# Patient Record
Sex: Male | Born: 1965
Health system: Southern US, Community
[De-identification: ages and names within clinical notes are randomized; demographics above are authoritative.]

## PROBLEM LIST (undated history)

## (undated) DIAGNOSIS — R221 Localized swelling, mass and lump, neck: Secondary | ICD-10-CM

## (undated) DIAGNOSIS — R7303 Prediabetes: Secondary | ICD-10-CM

## (undated) DIAGNOSIS — E785 Hyperlipidemia, unspecified: Secondary | ICD-10-CM

## (undated) DIAGNOSIS — B019 Varicella without complication: Secondary | ICD-10-CM

## (undated) DIAGNOSIS — K311 Adult hypertrophic pyloric stenosis: Secondary | ICD-10-CM

## (undated) DIAGNOSIS — K219 Gastro-esophageal reflux disease without esophagitis: Secondary | ICD-10-CM

## (undated) HISTORY — DX: Adult hypertrophic pyloric stenosis: K31.1

## (undated) HISTORY — DX: Prediabetes: R73.03

## (undated) HISTORY — DX: Localized swelling, mass and lump, neck: R22.1

## (undated) HISTORY — DX: Varicella without complication: B01.9

## (undated) HISTORY — DX: Gastro-esophageal reflux disease without esophagitis: K21.9

## (undated) HISTORY — PX: VASECTOMY: SHX75

## (undated) HISTORY — PX: NASAL SEPTUM SURGERY: SHX37

## (undated) HISTORY — DX: Hyperlipidemia, unspecified: E78.5

---

## 2005-06-21 ENCOUNTER — Emergency Department (HOSPITAL_COMMUNITY): Admission: EM | Admit: 2005-06-21 | Discharge: 2005-06-21 | Payer: Self-pay | Admitting: Emergency Medicine

## 2005-07-09 ENCOUNTER — Emergency Department (HOSPITAL_COMMUNITY): Admission: EM | Admit: 2005-07-09 | Discharge: 2005-07-09 | Payer: Self-pay | Admitting: Family Medicine

## 2011-01-05 ENCOUNTER — Encounter (HOSPITAL_BASED_OUTPATIENT_CLINIC_OR_DEPARTMENT_OTHER)
Admission: RE | Admit: 2011-01-05 | Discharge: 2011-01-05 | Disposition: A | Discharge status: Home / Self Care | Payer: BC Managed Care – PPO | Source: Ambulatory Visit | Attending: Plastic Surgery | Admitting: Plastic Surgery

## 2011-01-07 ENCOUNTER — Ambulatory Visit (HOSPITAL_BASED_OUTPATIENT_CLINIC_OR_DEPARTMENT_OTHER)
Admission: RE | Admit: 2011-01-07 | Discharge: 2011-01-07 | Disposition: A | Discharge status: Home / Self Care | Payer: BC Managed Care – PPO | Source: Ambulatory Visit | Attending: Plastic Surgery | Admitting: Plastic Surgery

## 2011-01-07 ENCOUNTER — Other Ambulatory Visit: Payer: Self-pay | Admitting: Plastic Surgery

## 2011-01-07 DIAGNOSIS — J342 Deviated nasal septum: Secondary | ICD-10-CM | POA: Insufficient documentation

## 2011-01-07 DIAGNOSIS — Z01812 Encounter for preprocedural laboratory examination: Secondary | ICD-10-CM | POA: Insufficient documentation

## 2011-01-07 DIAGNOSIS — Z01810 Encounter for preprocedural cardiovascular examination: Secondary | ICD-10-CM | POA: Insufficient documentation

## 2011-01-07 DIAGNOSIS — L723 Sebaceous cyst: Secondary | ICD-10-CM | POA: Insufficient documentation

## 2011-01-13 NOTE — Op Note (Signed)
  NAMEBRYSUN, Nicholas Arnold               ACCOUNT NO.:  [id number]  MEDICAL RECORD NO.:  [id number]  LOCATION:                                 FACILITY:  PHYSICIAN:  Wayland Denis, DO      DATE OF BIRTH:  [birth year]  DATE OF PROCEDURE:  01/07/2011 DATE OF DISCHARGE:                              OPERATIVE REPORT   PREOPERATIVE DIAGNOSIS:  Right neck cyst and deviated nasal septum.  POSTOPERATIVE DIAGNOSIS:  Right neck cyst and deviated nasal septum.  PROCEDURE:  Excision of neck cyst 1.5cm and repair of nasal septum and right inferior partial turbinectomy.  ATTENDING:  Wayland Denis, DO  ASSISTANT:  None.  ANESTHESIA:  General.  INDICATIONS FOR PROCEDURE:  [name] is a 45 year old male who has had a growing lesion on the right neck, specific in nature, who also had some trouble issues with breathing due to the septal deviation.  The decision was made to bring him to the OR for repair of the septal deviation and excision of the neck.  DESCRIPTION OF PROCEDURE:  The patient was taken to the operating room, placed in the operating room table in supine position.  General anesthesia was administered.  A time-out was called.  All information was confirmed to be correct.  He was prepped and draped in the usual sterile fashion.  A 1% lidocaine with epinephrine was injected and the neck around the cyst.  A #15-blade was used to make a linear incision over the lesion.  This was done after waiting several minutes for the epinephrine to take effect.  Small scissor was used to dissect around the cyst wall. The mental nerve was seen and left intact.  Hemostasis was achieved with electrocautery. The specimen was sent to pathology.  The area was irrigated with normal saline.  The skin was reapproximated with 5-0 Monocryl and Dermabond was applied, and dry dressings.  A gauze was applied.  Attention was turned to the nose, Afrin pledgets which were placed at the  start of the case were  removed.  1% lidocaine with epinephrine was injected. After waiting several minutes for the epinephrine to take effect a #15 blade was used to make an incision in the right inferior turbinate. The needle tip bovie was used to shrink the left inferior turbinate. A partial turbinectomy was performed with the bone graspers.  The septum was realigned and bilateral nasal splints were place and sutured with a 3-0 ethabond to keep them in place.  Antibiotic ointment was placed on the  nasal splints.  The patient tolerated the procedure well and there were no complications.  He was awoken and taken to recovery room in stable condition.     Wayland Denis, DO     CS/MEDQ  D:  01/07/2011  T:  01/07/2011  Job:  [id number]  Electronically Signed by Wayland Denis  on 01/13/2011 08:39:07 AM

## 2011-03-02 DIAGNOSIS — J3489 Other specified disorders of nose and nasal sinuses: Secondary | ICD-10-CM | POA: Insufficient documentation

## 2015-03-12 ENCOUNTER — Encounter: Payer: Self-pay | Admitting: Primary Care

## 2015-03-12 ENCOUNTER — Encounter (INDEPENDENT_AMBULATORY_CARE_PROVIDER_SITE_OTHER): Payer: Self-pay

## 2015-03-12 ENCOUNTER — Ambulatory Visit (INDEPENDENT_AMBULATORY_CARE_PROVIDER_SITE_OTHER): Payer: BLUE CROSS/BLUE SHIELD | Admitting: Primary Care

## 2015-03-12 VITALS — BP 126/90 | HR 81 | Temp 98.0°F | Ht 67.5 in | Wt 188.0 lb

## 2015-03-12 DIAGNOSIS — Z0000 Encounter for general adult medical examination without abnormal findings: Secondary | ICD-10-CM | POA: Insufficient documentation

## 2015-03-12 DIAGNOSIS — Z800 Family history of malignant neoplasm of digestive organs: Secondary | ICD-10-CM

## 2015-03-12 DIAGNOSIS — R030 Elevated blood-pressure reading, without diagnosis of hypertension: Secondary | ICD-10-CM | POA: Insufficient documentation

## 2015-03-12 DIAGNOSIS — Z23 Encounter for immunization: Secondary | ICD-10-CM

## 2015-03-12 LAB — COMPREHENSIVE METABOLIC PANEL
ALT: 36 U/L (ref 0–53)
AST: 26 U/L (ref 0–37)
Albumin: 4.5 g/dL (ref 3.5–5.2)
Alkaline Phosphatase: 43 U/L (ref 39–117)
BUN: 12 mg/dL (ref 6–23)
CHLORIDE: 103 meq/L (ref 96–112)
CO2: 28 meq/L (ref 19–32)
Calcium: 9.8 mg/dL (ref 8.4–10.5)
Creatinine, Ser: 0.98 mg/dL (ref 0.40–1.50)
GFR: 87.11 mL/min (ref >60.00)
Glucose, Bld: 109 mg/dL — ABNORMAL HIGH (ref 70–99)
POTASSIUM: 4 meq/L (ref 3.5–5.1)
SODIUM: 140 meq/L (ref 135–145)
Total Bilirubin: 0.7 mg/dL (ref 0.2–1.2)
Total Protein: 7 g/dL (ref 6.0–8.3)

## 2015-03-12 LAB — CBC
HCT: 45.9 % (ref 39.0–52.0)
Hemoglobin: 15.4 g/dL (ref 13.0–17.0)
MCHC: 33.7 g/dL (ref 30.0–36.0)
MCV: 86.8 fl (ref 78.0–100.0)
Platelets: 286 10*3/uL (ref 150.0–400.0)
RBC: 5.28 Mil/uL (ref 4.22–5.81)
RDW: 13 % (ref 11.5–15.5)
WBC: 6.1 10*3/uL (ref 4.0–10.5)

## 2015-03-12 LAB — LIPID PANEL
CHOLESTEROL: 272 mg/dL — AB (ref 0–200)
HDL: 52.4 mg/dL (ref >39.00)
LDL CALC: 184 mg/dL — AB (ref 0–99)
NonHDL: 219.94
TRIGLYCERIDES: 180 mg/dL — AB (ref 0.0–149.0)
Total CHOL/HDL Ratio: 5
VLDL: 36 mg/dL (ref 0.0–40.0)

## 2015-03-12 LAB — TSH: TSH: 1.73 u[IU]/mL (ref 0.35–4.50)

## 2015-03-12 LAB — PSA: PSA: 0.49 ng/mL (ref 0.10–4.00)

## 2015-03-12 LAB — HEMOGLOBIN A1C: Hgb A1c MFr Bld: 5.6 % (ref 4.6–6.5)

## 2015-03-12 LAB — VITAMIN D 25 HYDROXY (VIT D DEFICIENCY, FRACTURES): VITD: 41.55 ng/mL (ref 30.00–100.00)

## 2015-03-12 NOTE — Addendum Note (Signed)
Addended by: Jacqualin Combes on: 03/12/2015 03:30 PM   Modules accepted: Orders

## 2015-03-12 NOTE — Patient Instructions (Addendum)
Complete lab work prior to leaving today. I will notify you of your results.  Work to decrease fast food, pop tarts, and consumption of beer.   Start exercising. You should be getting 1 hour of moderate intensity exercise 5 days weekly.  Check your blood pressure daily, around the same time of day, for the next 2 weeks.   Ensure that you have rested for 30 minutes prior to checking your blood pressure. Record your readings and send them to me on My Chart.  You will be contacted regarding your referral to GI for your colonoscopy.  Please let us know if you have not heard back within one week.   It was a pleasure to meet you today! Please don't hesitate to call me with any questions. Welcome to [facility]!

## 2015-03-12 NOTE — Assessment & Plan Note (Signed)
Elevated today in clinic at 126/90, 130/90 upon recheck, has no prior history of elevated readings.  BP checked this morning at Dentist's office and was 120/80. He is to record readings for 2 weeks and send them to me on My Chart.

## 2015-03-12 NOTE — Progress Notes (Signed)
Pre visit review using our clinic review tool, if applicable. No additional management support is needed unless otherwise documented below in the visit note. 

## 2015-03-12 NOTE — Progress Notes (Signed)
Subjective:    Patient ID: Nicholas Arnold, male    DOB: 09-Mar-1966, 49 y.o.   MRN: [id number]  HPI  Nicholas Arnold is a 49 year old male who presents today to establish care and discuss the problems mentioned below. He's not visited a PCP in 10+ years and his last physical was as a child. He also presents today for complete physical.  1) Elevated Blood Pressure Reading: Elevated in clinic today at 126/90, 130/90 with recheck. He has his blood pressure measured this morning at the dentist's office which was 120/80. He admits to being nervous today in the office. He has a co-worker with a BP cuff and will start checking daily for the next 2 weeks.   Immunizations: -Tetanus: Completed over 10 years ago.  -Influenza: He will be getting at work this season.   Diet: Endorses a fair diet. Breakfast: Skips, sometimes oatmeal, pop tarts, grapefruit Lunch: Left overs from dinner, some fast food. Dinner: [place], fish, veggies, potatoes, tacos, chili, grilled venison, chicken. Limits red meat. Occasionally eats out. Snacks: Candy, chips Desserts: Occasionally, ice cream mostly. Beverages: Coffee (3 cups), water, beer ( about 18-24) Exercise: He leads an active lifestyle, but does not exercise daily. Eye exam: Not completed in several years. Dental exam: Completes semi-annually Colonoscopy: Family history of colon cancer (mother's brother) passed away at age 67. He's never had a colonoscopy in the past.    Review of Systems  Constitutional: Negative for unexpected weight change.  HENT: Negative for rhinorrhea.   Respiratory: Negative for cough and shortness of breath.   Cardiovascular: Negative for chest pain.  Gastrointestinal: Negative for diarrhea, constipation and blood in stool.  Genitourinary: Negative for difficulty urinating.  Musculoskeletal: Negative for myalgias and arthralgias.  Skin: Negative for rash.  Allergic/Immunologic: Negative for environmental allergies.  Neurological:  Negative for dizziness, numbness and headaches.  Psychiatric/Behavioral:       Denies concerns for anxiety or depression.        Past Medical History  Diagnosis Date  . GERD (gastroesophageal reflux disease)   . Chickenpox   . Pyloric stenosis   . Neck mass     removed in 2012, benign    Social History   Social History  . Marital Status: Married    Spouse Name: N/A  . Number of Children: N/A  . Years of Education: N/A   Occupational History  . Not on file.   Social History Main Topics  . Smoking status: Never Smoker   . Smokeless tobacco: Not on file  . Alcohol Use: 0.0 oz/week    0 Standard drinks or equivalent per week     Comment: social  . Drug Use: Not on file  . Sexual Activity: Not on file   Other Topics Concern  . Not on file   Social History Narrative   Married.   No children.   Works as a Therapist, nutritional.   Enjoys riding his motorcycle, spending time with pets and family, car maintenance, playing video games.    Past Surgical History  Procedure Laterality Date  . Vasectomy    . Nasal septum surgery      Family History  Problem Relation Age of Onset  . Hyperlipidemia Mother   . Hypertension Mother   . Arthritis Father   . Hyperlipidemia Father   . Hypertension Father   . Colon cancer Maternal Uncle   . Arthritis Maternal Grandmother   . Hyperlipidemia Maternal Grandmother   . Heart disease Maternal  Grandmother   . Hypertension Maternal Grandmother   . Stroke Maternal Grandfather   . Hypertension Maternal Grandfather   . Alcohol abuse Paternal Grandfather     No Known Allergies  No current outpatient prescriptions on file prior to visit.   No current facility-administered medications on file prior to visit.    BP 126/90 mmHg  Pulse 81  Temp(Src) 98 F (36.7 C) (Oral)  Ht 5' 7.5" (1.715 m)  Wt 188 lb (85.276 kg)  BMI 28.99 kg/m2  SpO2 97%    Objective:   Physical Exam  Constitutional: He is oriented to person, place, and  time. He appears well-nourished.  HENT:  Right Ear: Tympanic membrane and ear canal normal.  Left Ear: Tympanic membrane and ear canal normal.  Nose: Nose normal.  Mouth/Throat: Oropharynx is clear and moist.  Eyes: Conjunctivae and EOM are normal. Pupils are equal, round, and reactive to light.  Neck: Neck supple. No thyromegaly present.  Cardiovascular: Normal rate and regular rhythm.   Pulmonary/Chest: Effort normal and breath sounds normal.  Abdominal: Soft. Bowel sounds are normal.  Musculoskeletal: Normal range of motion.  Neurological: He is alert and oriented to person, place, and time. He has normal reflexes. No cranial nerve deficit. Coordination normal.  Skin: Skin is warm and dry.  Psychiatric: He has a normal mood and affect.          Assessment & Plan:

## 2015-03-12 NOTE — Assessment & Plan Note (Addendum)
Tetanus due, provided today. He will get influenza vaccination at work soon. Exam unremarkable. Labs pending. Discussed importance of healthy diet and regular exercise. Decrease pop tarts, beer, fast food. Sent for screening colonoscopy as he has a + family history and has not had screening. Follow up in 1 year for repeat physical.

## 2015-03-13 ENCOUNTER — Encounter: Payer: Self-pay | Admitting: *Deleted

## 2015-03-26 ENCOUNTER — Telehealth: Payer: Self-pay | Admitting: Primary Care

## 2015-03-26 NOTE — Telephone Encounter (Signed)
Will you please check on Nicholas Arnold' BP readings? He was supposed to check for the past 2 weeks. Thanks.

## 2015-03-26 NOTE — Telephone Encounter (Signed)
Message left for patient to return my call.  

## 2015-03-27 NOTE — Telephone Encounter (Deleted)
Called and notified patient of Kate's comments. Patient verbalized understanding.  

## 2015-03-28 ENCOUNTER — Telehealth: Payer: Self-pay | Admitting: Primary Care

## 2015-03-28 NOTE — Telephone Encounter (Signed)
Patient called back. His last readings are  123/79 132/91 116/70 133/82 137/85 122/77 121/94 123/89 126/82 114/81

## 2015-03-28 NOTE — Telephone Encounter (Signed)
Patient returned Chan's call. °

## 2015-03-28 NOTE — Telephone Encounter (Signed)
Noted. Please schedule him for a follow up appointment in 3 months for re-evaluation of BP and cholesterol. Thanks.

## 2015-03-28 NOTE — Telephone Encounter (Signed)
Message left for patient to return my call.  

## 2015-03-28 NOTE — Telephone Encounter (Signed)
Called patient and left message for a call back. See previous phone note.

## 2015-03-31 NOTE — Telephone Encounter (Signed)
Message left for patient to return my call. Sending patient a letter requesting patient to schedule appointment.

## 2015-04-03 ENCOUNTER — Encounter: Payer: Self-pay | Admitting: Gastroenterology

## 2015-06-04 ENCOUNTER — Encounter: Payer: Self-pay | Admitting: Gastroenterology

## 2015-06-04 ENCOUNTER — Ambulatory Visit (INDEPENDENT_AMBULATORY_CARE_PROVIDER_SITE_OTHER): Payer: BLUE CROSS/BLUE SHIELD | Admitting: Gastroenterology

## 2015-06-04 VITALS — BP 128/90 | HR 80 | Ht 67.5 in | Wt 184.4 lb

## 2015-06-04 DIAGNOSIS — K625 Hemorrhage of anus and rectum: Secondary | ICD-10-CM | POA: Diagnosis not present

## 2015-06-04 MED ORDER — NA SULFATE-K SULFATE-MG SULF 17.5-3.13-1.6 GM/177ML PO SOLN: 1.0000 | Freq: Once | ORAL | Status: DC

## 2015-06-04 NOTE — Patient Instructions (Signed)
You will be set up for a colonoscopy for minor rectal bleeding. 

## 2015-06-04 NOTE — Progress Notes (Signed)
HPI: This is a    very pleasant 50 year old man    who was referred to me by Pleas Koch, NP  to evaluate  minor rectal bleeding, family history colon cancer .    Chief complaint is minor rectal bleeding, family history of colon cancer  His wife is in healthcare.    He saw PCP recently.  His uncle had colon cancer (diagnosed in late 68s).  A neighbor died of colon cancer.  He has intermittent red rectal bleeding, along with minor anal pains.  Only very randomly.  Not alarming to him at all.  No real changes in his bowels, except dietary.  Overall weight has increased slowly over many years.  Had pyloric stenosis surgery at 72 weeks old.  Review of systems: Pertinent positive and negative review of systems were noted in the above HPI section. Complete review of systems was performed and was otherwise normal.   Past Medical History  Diagnosis Date  . GERD (gastroesophageal reflux disease)   . Chickenpox   . Pyloric stenosis   . Neck mass     removed in 2012, benign  . Hyperlipemia     Past Surgical History  Procedure Laterality Date  . Vasectomy    . Nasal septum surgery      No current outpatient prescriptions on file.   No current facility-administered medications for this visit.    Allergies as of 06/04/2015  . (No Known Allergies)    Family History  Problem Relation Age of Onset  . Hyperlipidemia Mother   . Hypertension Mother   . Arthritis Father   . Hyperlipidemia Father   . Hypertension Father   . Colon cancer Maternal Uncle   . Arthritis Maternal Grandmother   . Hyperlipidemia Maternal Grandmother   . Heart disease Maternal Grandmother   . Hypertension Maternal Grandmother   . Stroke Maternal Grandfather   . Hypertension Maternal Grandfather   . Alcohol abuse Paternal Grandfather     Social History   Social History  . Marital Status: Married    Spouse Name: N/A  . Number of Children: N/A  . Years of Education: N/A   Occupational  History  . Service Manager    Social History Main Topics  . Smoking status: Never Smoker   . Smokeless tobacco: Never Used  . Alcohol Use: 0.0 oz/week    0 Standard drinks or equivalent per week     Comment: social  . Drug Use: No  . Sexual Activity: Not on file   Other Topics Concern  . Not on file   Social History Narrative   Married.   No children.   Works as a Therapist, nutritional.   Enjoys riding his motorcycle, spending time with pets and family, car maintenance, playing video games.     Physical Exam: BP 128/90 mmHg  Pulse 80  Ht 5' 7.5" (1.715 m)  Wt 184 lb 6.4 oz (83.643 kg)  BMI 28.44 kg/m2 Constitutional: generally well-appearing Psychiatric: alert and oriented x3 Eyes: extraocular movements intact Mouth: oral pharynx moist, no lesions Neck: supple no lymphadenopathy Cardiovascular: heart regular rate and rhythm Lungs: clear to auscultation bilaterally Abdomen: soft, nontender, nondistended, no obvious ascites, no peritoneal signs, normal bowel sounds Extremities: no lower extremity edema bilaterally Skin: no lesions on visible extremities Rectal exam deferred for upcoming colonoscopy  Assessment and plan: 50 y.o. male with  minor rectal bleeding, second-degree relative with colon cancer  I explained to him that a single second-degree relative with  colon cancer does not necessarily increase his personal risk for colon cancer. He would still be screened starting at age 37. He has however had some minor rectal bleeding that I think is unlikely neoplastic in origin but I think it is safest to proceed with full colonoscopy for diagnostic purposes in the next few weeks. I see no reason for any further blood tests or imaging studies prior to that.   Owens Loffler, MD [facility] 06/04/2015, 8:40 AM  Cc: Pleas Koch, NP

## 2015-06-13 ENCOUNTER — Encounter: Payer: Self-pay | Admitting: Primary Care

## 2015-06-13 ENCOUNTER — Telehealth: Payer: Self-pay | Admitting: Primary Care

## 2015-06-13 ENCOUNTER — Ambulatory Visit (INDEPENDENT_AMBULATORY_CARE_PROVIDER_SITE_OTHER): Payer: BLUE CROSS/BLUE SHIELD | Admitting: Primary Care

## 2015-06-13 VITALS — BP 122/84 | HR 65 | Temp 97.8°F | Ht 68.0 in | Wt 184.0 lb

## 2015-06-13 DIAGNOSIS — R030 Elevated blood-pressure reading, without diagnosis of hypertension: Secondary | ICD-10-CM | POA: Diagnosis not present

## 2015-06-13 DIAGNOSIS — E785 Hyperlipidemia, unspecified: Secondary | ICD-10-CM

## 2015-06-13 LAB — LIPID PANEL
CHOLESTEROL: 217 mg/dL — AB (ref 0–200)
HDL: 60.8 mg/dL (ref >39.00)
LDL CALC: 139 mg/dL — AB (ref 0–99)
NONHDL: 156.54
Total CHOL/HDL Ratio: 4
Triglycerides: 87 mg/dL (ref 0.0–149.0)
VLDL: 17.4 mg/dL (ref 0.0–40.0)

## 2015-06-13 NOTE — Progress Notes (Signed)
Pre visit review using our clinic review tool, if applicable. No additional management support is needed unless otherwise documented below in the visit note. 

## 2015-06-13 NOTE — Patient Instructions (Addendum)
Complete lab work prior to leaving today. I will notify you of your results once received.   Continue your efforts towards a healthy diet and exercise. Congratulations on your success towards a healthy lifestyle.  Follow up in mid-late October for repeat physical.  It was a pleasure to see you today!

## 2015-06-13 NOTE — Telephone Encounter (Signed)
Pt returned lab result call. Please advise

## 2015-06-13 NOTE — Assessment & Plan Note (Addendum)
Improved since last visit. Significant improvement in diet. Home readings ranging from 115-130's/60's-80's. Stable in office today. Will continue to monitor.

## 2015-06-13 NOTE — Progress Notes (Signed)
Subjective:    Patient ID: Nicholas Arnold, male    DOB: [birth year], 50 y.o.   MRN: [id number]  HPI  [name] is a 50 year old male who presents today for follow up.  1) Elevated Blood Pressure Reading: He was evaluated as a new patient on 03/12/15 and noted to have elevated diastolic readings at 90 in the office. He sent in his home BP readings 2 weeks later which were averaging 120-130's/80-90's. He presents today with daily home readings ranging from 115-130's-60's-80's. Denies chest pain, dizziness, shortness of breath.   2) Hyperlipidemia: TC of 272, Trigs of 180, and LDL of 184 last visit. He was to work on improving his diet and start exercising. Since his last visit in October he's lost 4 pounds. He's cut back on junk food, alcohol, and fast food. He's increased consumption of fruits, nuts, water. He's kept food journals for awareness of his intake. He's drinking about 2 liters of water daily. He is not currently exercising.   Wt Readings from Last 3 Encounters:  06/13/15 184 lb (83.462 kg)  06/04/15 184 lb 6.4 oz (83.643 kg)  03/12/15 188 lb (85.276 kg)     Review of Systems  Respiratory: Negative for shortness of breath.   Cardiovascular: Negative for chest pain.  Neurological: Negative for dizziness and numbness.       Past Medical History  Diagnosis Date  . GERD (gastroesophageal reflux disease)   . Chickenpox   . Pyloric stenosis   . Neck mass     removed in 2012, benign  . Hyperlipemia     Social History   Social History  . Marital Status: Married    Spouse Name: N/A  . Number of Children: N/A  . Years of Education: N/A   Occupational History  . Service Manager    Social History Main Topics  . Smoking status: Never Smoker   . Smokeless tobacco: Never Used  . Alcohol Use: 0.0 oz/week    0 Standard drinks or equivalent per week     Comment: social  . Drug Use: No  . Sexual Activity: Not on file   Other Topics Concern  . Not on file   Social  History Narrative   Married.   No children.   Works as a Therapist, nutritional.   Enjoys riding his motorcycle, spending time with pets and family, car maintenance, playing video games.    Past Surgical History  Procedure Laterality Date  . Vasectomy    . Nasal septum surgery      Family History  Problem Relation Age of Onset  . Hyperlipidemia Mother   . Hypertension Mother   . Arthritis Father   . Hyperlipidemia Father   . Hypertension Father   . Colon cancer Maternal Uncle   . Arthritis Maternal Grandmother   . Hyperlipidemia Maternal Grandmother   . Heart disease Maternal Grandmother   . Hypertension Maternal Grandmother   . Stroke Maternal Grandfather   . Hypertension Maternal Grandfather   . Alcohol abuse Paternal Grandfather     No Known Allergies  Current Outpatient Prescriptions on File Prior to Visit  Medication Sig Dispense Refill  . Na Sulfate-K Sulfate-Mg Sulf SOLN Take 1 kit by mouth once. (Patient not taking: Reported on 06/13/2015) 354 mL 0   No current facility-administered medications on file prior to visit.    BP 122/84 mmHg  Pulse 65  Temp(Src) 97.8 F (36.6 C) (Oral)  Ht _0  (1.727 m)  Wt  184 lb (83.462 kg)  BMI 27.98 kg/m2  SpO2 96%    Objective:   Physical Exam  Constitutional: He appears well-nourished.  Cardiovascular: Normal rate and regular rhythm.   Pulmonary/Chest: Effort normal and breath sounds normal.  Skin: Skin is warm and dry.          Assessment & Plan:

## 2015-06-13 NOTE — Assessment & Plan Note (Signed)
TC of 272, Trigs 180, LDL of 184 last visit. He was challenged to improve his diet and start exercising to improve levels. Significant change in diet. Weight loss of 4 pounds. Lipid panel pending today. Encouraged him to continue efforts towards healthy lifestyle.

## 2015-06-16 ENCOUNTER — Encounter: Payer: Self-pay | Admitting: *Deleted

## 2015-06-16 NOTE — Telephone Encounter (Signed)
Called and notified patient of Kate's comments. Patient verbalized understanding.  

## 2015-07-22 ENCOUNTER — Encounter: Payer: Self-pay | Admitting: Gastroenterology

## 2015-07-22 ENCOUNTER — Ambulatory Visit (AMBULATORY_SURGERY_CENTER): Payer: BLUE CROSS/BLUE SHIELD | Admitting: Gastroenterology

## 2015-07-22 VITALS — BP 114/63 | HR 63 | Temp 98.0°F | Resp 11 | Ht 67.0 in | Wt 184.0 lb

## 2015-07-22 DIAGNOSIS — K635 Polyp of colon: Secondary | ICD-10-CM

## 2015-07-22 DIAGNOSIS — K625 Hemorrhage of anus and rectum: Secondary | ICD-10-CM | POA: Diagnosis not present

## 2015-07-22 DIAGNOSIS — D128 Benign neoplasm of rectum: Secondary | ICD-10-CM

## 2015-07-22 DIAGNOSIS — D129 Benign neoplasm of anus and anal canal: Secondary | ICD-10-CM

## 2015-07-22 DIAGNOSIS — K621 Rectal polyp: Secondary | ICD-10-CM | POA: Diagnosis not present

## 2015-07-22 MED ORDER — SODIUM CHLORIDE 0.9 % IV SOLN: 500.0000 mL | INTRAVENOUS | Status: DC

## 2015-07-22 NOTE — Op Note (Addendum)
[facility] [place]  [address]. [place], [address]   COLONOSCOPY PROCEDURE REPORT  PATIENT: Nicholas Arnold, Nicholas Arnold  MR#: [id number] BIRTHDATE: Nov 02, 1965 , 36  yrs. old GENDER: male ENDOSCOPIST: Milus Banister, MD REFERRED [id number] Carlis Abbott, MD PROCEDURE DATE:  07/22/2015 PROCEDURE:   Colonoscopy, diagnostic and Colonoscopy with snare polypectomy First Screening Colonoscopy - Avg.  risk and is 50 yrs.  old or older - No.  Prior Negative Screening - Now for repeat screening. N/A  History of Adenoma - Now for follow-up colonoscopy & has been > or = to 3 yrs.  N/A  Polyps removed today? Yes ASA CLASS:   Class II INDICATIONS:minor rectal bleeding. MEDICATIONS: Monitored anesthesia care and Propofol 300 mg IV  DESCRIPTION OF PROCEDURE:   After the risks benefits and alternatives of the procedure were thoroughly explained, informed consent was obtained.  The digital rectal exam revealed no abnormalities of the rectum.   The LB [id number]  endoscope was introduced through the anus and advanced to the cecum, which was identified by both the appendix and ileocecal valve. No adverse events experienced.   The quality of the prep was excellent.  The instrument was then slowly withdrawn as the colon was fully examined. Estimated blood loss is zero unless otherwise noted in this procedure report.   COLON FINDINGS: One polyp was found, removed and sent to pathology. This was 107mm, sessile, located in rectum, removed with cold snare. There were small external hemorrhoids, not thrombosed.  The examination was otherwise normal.  Retroflexed views revealed no abnormalities. The time to cecum = 3.3 Withdrawal time = 7.7   The scope was withdrawn and the procedure completed. COMPLICATIONS: There were no immediate complications.  ENDOSCOPIC IMPRESSION: One polyp was found, removed and sent to pathology. There were small external hemorrhoids, not thrombosed. The examination was  otherwise normal  RECOMMENDATIONS: YAwait final pathology results.  Colonsocopyin 5 years if this is a precancerous polyp.  eSigned:  Milus Banister, MD 07/22/2015 7:56 AM Revised: 07/22/2015 7:56 AM

## 2015-07-22 NOTE — Progress Notes (Signed)
Called to room to assist during endoscopic procedure.  Patient ID and intended procedure confirmed with present staff. Received instructions for my participation in the procedure from the performing physician.  

## 2015-07-22 NOTE — Progress Notes (Signed)
To PACU  Awake and alert report to RN

## 2015-07-22 NOTE — Patient Instructions (Signed)
YOU HAD AN ENDOSCOPIC PROCEDURE TODAY AT [facility]:   Refer to the procedure report that was given to you for any specific questions about what was found during the examination.  If the procedure report does not answer your questions, please call your gastroenterologist to clarify.  If you requested that your care partner not be given the details of your procedure findings, then the procedure report has been included in a sealed envelope for you to review at your convenience later.  YOU SHOULD EXPECT: Some feelings of bloating in the abdomen. Passage of more gas than usual.  Walking can help get rid of the air that was put into your GI tract during the procedure and reduce the bloating. If you had a lower endoscopy (such as a colonoscopy or flexible sigmoidoscopy) you may notice spotting of blood in your stool or on the toilet paper. If you underwent a bowel prep for your procedure, you may not have a normal bowel movement for a few days.  Please Note:  You might notice some irritation and congestion in your nose or some drainage.  This is from the oxygen used during your procedure.  There is no need for concern and it should clear up in a day or so.  SYMPTOMS TO REPORT IMMEDIATELY:   Following lower endoscopy (colonoscopy or flexible sigmoidoscopy):  Excessive amounts of blood in the stool  Significant tenderness or worsening of abdominal pains  Swelling of the abdomen that is new, acute  Fever of 100F or higher    For urgent or emergent issues, a gastroenterologist can be reached at any hour by calling [phone].   DIET: Your first meal following the procedure should be a small meal and then it is ok to progress to your normal diet. Heavy or fried foods are harder to digest and may make you feel nauseous or bloated.  Likewise, meals heavy in dairy and vegetables can increase bloating.  Drink plenty of fluids but you should avoid alcoholic beverages for 24  hours.  ACTIVITY:  You should plan to take it easy for the rest of today and you should NOT DRIVE or use heavy machinery until tomorrow (because of the sedation medicines used during the test).    FOLLOW UP: Our staff will call the number listed on your records the next business day following your procedure to check on you and address any questions or concerns that you may have regarding the information given to you following your procedure. If we do not reach you, we will leave a message.  However, if you are feeling well and you are not experiencing any problems, there is no need to return our call.  We will assume that you have returned to your regular daily activities without incident.  If any biopsies were taken you will be contacted by phone or by letter within the next 1-3 weeks.  Please call us at [phone] if you have not heard about the biopsies in 3 weeks.    SIGNATURES/CONFIDENTIALITY: You and/or your care partner have signed paperwork which will be entered into your electronic medical record.  These signatures attest to the fact that that the information above on your After Visit Summary has been reviewed and is understood.  Full responsibility of the confidentiality of this discharge information lies with you and/or your care-partner.   Resume any medication taken prior to procedure. Information given on polyps and hemorrhoids.

## 2015-07-23 ENCOUNTER — Telehealth: Payer: Self-pay | Admitting: *Deleted

## 2015-07-23 NOTE — Telephone Encounter (Signed)
  Follow up Call-  Call back number 07/22/2015  Post procedure Call Back phone  # [phone]  Permission to leave phone message Yes   [facility]

## 2015-07-30 ENCOUNTER — Encounter: Payer: Self-pay | Admitting: Gastroenterology

## 2016-08-12 ENCOUNTER — Other Ambulatory Visit: Payer: Self-pay | Admitting: Primary Care

## 2016-08-12 DIAGNOSIS — E785 Hyperlipidemia, unspecified: Secondary | ICD-10-CM

## 2016-08-13 ENCOUNTER — Encounter (INDEPENDENT_AMBULATORY_CARE_PROVIDER_SITE_OTHER): Payer: Self-pay

## 2016-08-13 ENCOUNTER — Other Ambulatory Visit (INDEPENDENT_AMBULATORY_CARE_PROVIDER_SITE_OTHER): Payer: BLUE CROSS/BLUE SHIELD

## 2016-08-13 DIAGNOSIS — E785 Hyperlipidemia, unspecified: Secondary | ICD-10-CM

## 2016-08-13 LAB — COMPREHENSIVE METABOLIC PANEL
ALT: 27 U/L (ref 0–53)
AST: 22 U/L (ref 0–37)
Albumin: 4.4 g/dL (ref 3.5–5.2)
Alkaline Phosphatase: 39 U/L (ref 39–117)
BILIRUBIN TOTAL: 0.6 mg/dL (ref 0.2–1.2)
BUN: 15 mg/dL (ref 6–23)
CHLORIDE: 105 meq/L (ref 96–112)
CO2: 31 meq/L (ref 19–32)
CREATININE: 1.14 mg/dL (ref 0.40–1.50)
Calcium: 9.5 mg/dL (ref 8.4–10.5)
GFR: 72.72 mL/min (ref >60.00)
Glucose, Bld: 103 mg/dL — ABNORMAL HIGH (ref 70–99)
Potassium: 4.1 mEq/L (ref 3.5–5.1)
SODIUM: 142 meq/L (ref 135–145)
Total Protein: 6.9 g/dL (ref 6.0–8.3)

## 2016-08-13 LAB — LIPID PANEL
CHOLESTEROL: 245 mg/dL — AB (ref 0–200)
HDL: 45.7 mg/dL (ref >39.00)
LDL CALC: 174 mg/dL — AB (ref 0–99)
NONHDL: 199.67
Total CHOL/HDL Ratio: 5
Triglycerides: 128 mg/dL (ref 0.0–149.0)
VLDL: 25.6 mg/dL (ref 0.0–40.0)

## 2016-08-13 LAB — HEMOGLOBIN A1C: HEMOGLOBIN A1C: 5.8 % (ref 4.6–6.5)

## 2016-08-17 ENCOUNTER — Ambulatory Visit (INDEPENDENT_AMBULATORY_CARE_PROVIDER_SITE_OTHER): Payer: BLUE CROSS/BLUE SHIELD | Admitting: Primary Care

## 2016-08-17 ENCOUNTER — Encounter: Payer: Self-pay | Admitting: *Deleted

## 2016-08-17 ENCOUNTER — Encounter: Payer: Self-pay | Admitting: Primary Care

## 2016-08-17 DIAGNOSIS — R030 Elevated blood-pressure reading, without diagnosis of hypertension: Secondary | ICD-10-CM | POA: Diagnosis not present

## 2016-08-17 DIAGNOSIS — Z0000 Encounter for general adult medical examination without abnormal findings: Secondary | ICD-10-CM

## 2016-08-17 DIAGNOSIS — E785 Hyperlipidemia, unspecified: Secondary | ICD-10-CM | POA: Diagnosis not present

## 2016-08-17 DIAGNOSIS — R7303 Prediabetes: Secondary | ICD-10-CM

## 2016-08-17 NOTE — Patient Instructions (Signed)
Your cholesterol is slightly too high and has increased since 2017. Your labs also show prediabetes. Both of these lab levels will improve with regular exercise and healthy diet.  Start exercising. You should be getting 150 minutes of moderate intensity exercise weekly.  Continue your efforts towards a healthy diet. Increase vegetables, fruit, whole grains. Limit alcohol, sweets, processed foods.  Schedule a lab only appointment in six months to recheck your cholesterol levels.  Follow up in 1 year for your annual exam.  It was a pleasure to see you today!

## 2016-08-17 NOTE — Assessment & Plan Note (Signed)
Stable today. Continue to monitor.  ?

## 2016-08-17 NOTE — Assessment & Plan Note (Signed)
Tdap UTD. Discussed to increase vegetables, fruit, whole grains, exercise. Exam unremarkable. Labs with hyperlipidemia, prediabetes. Follow up in 6 months for recheck of lipids, 1 year for annual exam.

## 2016-08-17 NOTE — Progress Notes (Signed)
Subjective:    Patient ID: Nicholas Arnold, male    DOB: September 02, 1965, 51 y.o.   MRN: [id number]  HPI  Nicholas Arnold is a 51 year old male who presents today for complete physical.  Immunizations: -Tetanus: Completed in 2016 -Influenza: Did not complete last season   Diet: He endorses a fair diet.  Breakfast: Fruit, oatmeal, occasional eggs/bacon Lunch: Left overs Dinner: [place], soups, vegetables, salads with protein, occasional pizza Snacks: Nuts, pretzels, occasional chips, fruit Desserts: Occasionally, 2-3 times weekly. Beverages: Coffee, water, occasional beer  Exercise: He does not exercise regularly. Plays roller hockey once weekly. Eye exam: Completed years ago. Wears readers.  Dental exam: Completes semi-annually   Review of Systems  Constitutional: Negative for unexpected weight change.  HENT: Negative for rhinorrhea.   Respiratory: Negative for cough and shortness of breath.   Cardiovascular: Negative for chest pain.  Gastrointestinal: Negative for constipation and diarrhea.  Genitourinary: Negative for difficulty urinating.  Musculoskeletal: Negative for arthralgias and myalgias.  Skin: Negative for rash.  Allergic/Immunologic: Negative for environmental allergies.  Neurological: Negative for dizziness, numbness and headaches.  Psychiatric/Behavioral:       He denies concerns for anxiety or depression       Past Medical History:  Diagnosis Date  . Chickenpox   . GERD (gastroesophageal reflux disease)   . Hyperlipemia   . Neck mass    removed in 2012, benign  . Pyloric stenosis      Social History   Social History  . Marital status: Married    Spouse name: N/A  . Number of children: N/A  . Years of education: N/A   Occupational History  . Service Manager    Social History Main Topics  . Smoking status: Never Smoker  . Smokeless tobacco: Never Used  . Alcohol use 0.0 oz/week     Comment: social  . Drug use: No  . Sexual activity: Not on file     Other Topics Concern  . Not on file   Social History Narrative   Married.   No children.   Works as a Therapist, nutritional.   Enjoys riding his motorcycle, spending time with pets and family, car maintenance, playing video games.    Past Surgical History:  Procedure Laterality Date  . NASAL SEPTUM SURGERY    . VASECTOMY      Family History  Problem Relation Age of Onset  . Hyperlipidemia Mother   . Hypertension Mother   . Arthritis Father   . Hyperlipidemia Father   . Hypertension Father   . Colon cancer Maternal Uncle   . Arthritis Maternal Grandmother   . Hyperlipidemia Maternal Grandmother   . Heart disease Maternal Grandmother   . Hypertension Maternal Grandmother   . Stroke Maternal Grandfather   . Hypertension Maternal Grandfather   . Alcohol abuse Paternal Grandfather     No Known Allergies  Current Outpatient Prescriptions on File Prior to Visit  Medication Sig Dispense Refill  . Multiple Vitamin (MULTIVITAMIN) tablet Take 1 tablet by mouth daily.     No current facility-administered medications on file prior to visit.     BP 126/84   Pulse 71   Temp 98.1 F (36.7 C) (Oral)   Ht 5' 7.5" (1.715 m)   Wt 192 lb 12.8 oz (87.5 kg)   SpO2 95%   BMI 29.75 kg/m    Objective:   Physical Exam  Constitutional: He is oriented to person, place, and time. He appears well-nourished.  HENT:  Right Ear: Tympanic membrane and ear canal normal.  Left Ear: Tympanic membrane and ear canal normal.  Nose: Nose normal. Right sinus exhibits no maxillary sinus tenderness and no frontal sinus tenderness. Left sinus exhibits no maxillary sinus tenderness and no frontal sinus tenderness.  Mouth/Throat: Oropharynx is clear and moist.  Eyes: Conjunctivae and EOM are normal. Pupils are equal, round, and reactive to light.  Neck: Neck supple. Carotid bruit is not present. No thyromegaly present.  Cardiovascular: Normal rate, regular rhythm and normal heart sounds.    Pulmonary/Chest: Effort normal and breath sounds normal. He has no wheezes. He has no rales.  Abdominal: Soft. Bowel sounds are normal. There is no tenderness.  Musculoskeletal: Normal range of motion.  Neurological: He is alert and oriented to person, place, and time. He has normal reflexes. No cranial nerve deficit.  Skin: Skin is warm and dry.  Psychiatric: He has a normal mood and affect.          Assessment & Plan:

## 2016-08-17 NOTE — Assessment & Plan Note (Signed)
Recent A1C of 5.8. Discussed to limit alcohol and sweets, start exercising.

## 2016-08-17 NOTE — Progress Notes (Signed)
Pre visit review using our clinic review tool, if applicable. No additional management support is needed unless otherwise documented below in the visit note. 

## 2016-08-17 NOTE — Assessment & Plan Note (Signed)
Above goal, trending back up. Not exercising like he was. Will have him start exercising, continue to work on diet. Recheck in six months, continue supplements. He does refuse medication at this time. Continue to monitor.

## 2017-02-01 ENCOUNTER — Other Ambulatory Visit: Payer: Self-pay | Admitting: Primary Care

## 2017-02-01 DIAGNOSIS — E785 Hyperlipidemia, unspecified: Secondary | ICD-10-CM

## 2017-02-01 DIAGNOSIS — R7303 Prediabetes: Secondary | ICD-10-CM

## 2017-02-08 ENCOUNTER — Encounter (INDEPENDENT_AMBULATORY_CARE_PROVIDER_SITE_OTHER): Payer: Self-pay

## 2017-02-08 ENCOUNTER — Other Ambulatory Visit (INDEPENDENT_AMBULATORY_CARE_PROVIDER_SITE_OTHER): Payer: BLUE CROSS/BLUE SHIELD

## 2017-02-08 DIAGNOSIS — R7303 Prediabetes: Secondary | ICD-10-CM | POA: Diagnosis not present

## 2017-02-08 DIAGNOSIS — E785 Hyperlipidemia, unspecified: Secondary | ICD-10-CM

## 2017-02-08 LAB — LIPID PANEL
CHOL/HDL RATIO: 3
Cholesterol: 231 mg/dL — ABNORMAL HIGH (ref 0–200)
HDL: 68.5 mg/dL (ref >39.00)
LDL Cholesterol: 137 mg/dL — ABNORMAL HIGH (ref 0–99)
NonHDL: 162.19
Triglycerides: 124 mg/dL (ref 0.0–149.0)
VLDL: 24.8 mg/dL (ref 0.0–40.0)

## 2017-02-08 LAB — HEMOGLOBIN A1C: HEMOGLOBIN A1C: 5.6 % (ref 4.6–6.5)

## 2017-06-10 ENCOUNTER — Encounter: Payer: Self-pay | Admitting: Family Medicine

## 2017-06-10 ENCOUNTER — Ambulatory Visit: Payer: BLUE CROSS/BLUE SHIELD | Admitting: Family Medicine

## 2017-06-10 DIAGNOSIS — R6889 Other general symptoms and signs: Secondary | ICD-10-CM | POA: Diagnosis not present

## 2017-06-10 MED ORDER — OSELTAMIVIR PHOSPHATE 75 MG PO CAPS: 75.0000 mg | ORAL_CAPSULE | Freq: Two times a day (BID) | ORAL | 0 refills | Status: DC

## 2017-06-10 NOTE — Patient Instructions (Addendum)
Presumed flu. Start tamiflu.  Rest and fluids.  Get a flu shot when you get better.  Take care.  Glad to see you.

## 2017-06-10 NOTE — Progress Notes (Signed)
Fever.  Sx started about 1 day ago.  Felt diffusely weak, some cough and sneezing.  Some muscle aches.  Then this AM, felt worse.  Fever, congestion, chest is sore from cough.  Out of work today.  Mult sick contacts with dx'd flu at work.  Hadn't had a flu shot this year.  Discussed.    H/o PNA about 10 years ago.    Meds, vitals, and allergies reviewed.   ROS: Per HPI unless specifically indicated in ROS section   GEN: nad, alert and oriented HEENT: mucous membranes moist, tm w/o erythema, nasal exam w/o erythema, clear discharge noted,  OP with cobblestoning NECK: supple w/o LA CV: rrr.   PULM: ctab, no inc wob EXT: no edema SKIN: no acute rash  D/w pt about defering flu testing since it would likely not change management.

## 2017-06-12 ENCOUNTER — Encounter: Payer: Self-pay | Admitting: Family Medicine

## 2017-06-12 DIAGNOSIS — R6889 Other general symptoms and signs: Secondary | ICD-10-CM | POA: Insufficient documentation

## 2017-06-12 NOTE — Assessment & Plan Note (Signed)
Presumed flu with relatively mild symptoms.  Nontoxic.  Start Tamiflu.  Routine cautions given.  Update [place] as needed.  Needs to get routine flu shot yearly, discussed with patient. D/w pt about defering flu testing since it would likely not change management. He agrees.  Okay for outpatient follow-up.

## 2018-06-29 DIAGNOSIS — J1189 Influenza due to unidentified influenza virus with other manifestations: Secondary | ICD-10-CM | POA: Diagnosis not present

## 2018-08-17 ENCOUNTER — Encounter: Payer: BLUE CROSS/BLUE SHIELD | Admitting: Primary Care

## 2019-01-11 ENCOUNTER — Other Ambulatory Visit: Payer: Self-pay

## 2019-01-11 DIAGNOSIS — Z20822 Contact with and (suspected) exposure to covid-19: Secondary | ICD-10-CM

## 2019-01-12 LAB — NOVEL CORONAVIRUS, NAA: SARS-CoV-2, NAA: NOT DETECTED

## 2020-01-10 DIAGNOSIS — H5201 Hypermetropia, right eye: Secondary | ICD-10-CM | POA: Diagnosis not present

## 2020-01-10 DIAGNOSIS — H10413 Chronic giant papillary conjunctivitis, bilateral: Secondary | ICD-10-CM | POA: Diagnosis not present

## 2020-05-28 ENCOUNTER — Encounter: Payer: Self-pay | Admitting: Primary Care

## 2020-06-24 ENCOUNTER — Encounter: Payer: Self-pay | Admitting: Primary Care

## 2020-06-24 ENCOUNTER — Other Ambulatory Visit: Payer: Self-pay

## 2020-06-24 ENCOUNTER — Ambulatory Visit (INDEPENDENT_AMBULATORY_CARE_PROVIDER_SITE_OTHER): Payer: BC Managed Care – PPO | Admitting: Primary Care

## 2020-06-24 VITALS — BP 124/82 | HR 81 | Temp 97.8°F | Ht 68.0 in | Wt 189.1 lb

## 2020-06-24 DIAGNOSIS — E785 Hyperlipidemia, unspecified: Secondary | ICD-10-CM

## 2020-06-24 DIAGNOSIS — Z0000 Encounter for general adult medical examination without abnormal findings: Secondary | ICD-10-CM

## 2020-06-24 DIAGNOSIS — Z125 Encounter for screening for malignant neoplasm of prostate: Secondary | ICD-10-CM

## 2020-06-24 DIAGNOSIS — Z23 Encounter for immunization: Secondary | ICD-10-CM | POA: Diagnosis not present

## 2020-06-24 DIAGNOSIS — R7303 Prediabetes: Secondary | ICD-10-CM

## 2020-06-24 DIAGNOSIS — R030 Elevated blood-pressure reading, without diagnosis of hypertension: Secondary | ICD-10-CM

## 2020-06-24 DIAGNOSIS — N529 Male erectile dysfunction, unspecified: Secondary | ICD-10-CM

## 2020-06-24 LAB — COMPREHENSIVE METABOLIC PANEL
ALT: 30 U/L (ref 0–53)
AST: 22 U/L (ref 0–37)
Albumin: 4.5 g/dL (ref 3.5–5.2)
Alkaline Phosphatase: 41 U/L (ref 39–117)
BUN: 19 mg/dL (ref 6–23)
CO2: 29 mEq/L (ref 19–32)
Calcium: 9.7 mg/dL (ref 8.4–10.5)
Chloride: 106 mEq/L (ref 96–112)
Creatinine, Ser: 1 mg/dL (ref 0.40–1.50)
GFR: 86.63 mL/min (ref >60.00)
Glucose, Bld: 99 mg/dL (ref 70–99)
Potassium: 4.1 mEq/L (ref 3.5–5.1)
Sodium: 140 mEq/L (ref 135–145)
Total Bilirubin: 0.5 mg/dL (ref 0.2–1.2)
Total Protein: 6.8 g/dL (ref 6.0–8.3)

## 2020-06-24 LAB — LIPID PANEL
Cholesterol: 208 mg/dL — ABNORMAL HIGH (ref 0–200)
HDL: 46.4 mg/dL (ref >39.00)
LDL Cholesterol: 142 mg/dL — ABNORMAL HIGH (ref 0–99)
NonHDL: 161.12
Total CHOL/HDL Ratio: 4
Triglycerides: 98 mg/dL (ref 0.0–149.0)
VLDL: 19.6 mg/dL (ref 0.0–40.0)

## 2020-06-24 LAB — CBC
HCT: 42.5 % (ref 39.0–52.0)
Hemoglobin: 14.7 g/dL (ref 13.0–17.0)
MCHC: 34.5 g/dL (ref 30.0–36.0)
MCV: 84.2 fl (ref 78.0–100.0)
Platelets: 234 10*3/uL (ref 150.0–400.0)
RBC: 5.05 Mil/uL (ref 4.22–5.81)
RDW: 13.2 % (ref 11.5–15.5)
WBC: 4.6 10*3/uL (ref 4.0–10.5)

## 2020-06-24 LAB — PSA: PSA: 0.49 ng/mL (ref 0.10–4.00)

## 2020-06-24 LAB — TESTOSTERONE: Testosterone: 249.87 ng/dL — ABNORMAL LOW (ref 300.00–890.00)

## 2020-06-24 LAB — HEMOGLOBIN A1C: Hgb A1c MFr Bld: 5.7 % (ref 4.6–6.5)

## 2020-06-24 NOTE — Assessment & Plan Note (Signed)
Commended him on a healthy diet and regular exercise, repeat lipid panel pending today.

## 2020-06-24 NOTE — Progress Notes (Signed)
Subjective:    Patient ID: Nicholas Arnold, male    DOB: 1965/11/23, 55 y.o.   MRN: [id number]  HPI  This visit occurred during the SARS-CoV-2 public health emergency.  Safety protocols were in place, including screening questions prior to the visit, additional usage of staff PPE, and extensive cleaning of exam room while observing appropriate contact time as indicated for disinfecting solutions.   Nicholas Arnold is a 55 year old male who presents today to re-establish care and  for complete physical. He has not been evaluated by me since 2018 and has has not been seen in our practice since January 2019.  He would also like his testosterone evaluated. He is having difficulty maintaining an erection, has been intermittent for years, has progressed over the years. He is questioning whether or not his testosterone is low.   Immunizations: -Tetanus: 2016 -Influenza: Due today -Shingles: Never completed -Covid-19: Completed one dose of J & J  Diet: He endorses a healthy diet. Exercise: He is exercising 5 days weekly, active at home.  Eye exam:  Completed in 2021 Dental exam: Completes semi-annually   Colonoscopy: Completed in 2017, due in 2027 PSA: Due Hep C Screen: Due  BP Readings from Last 3 Encounters:  06/24/20 124/82  06/10/17 126/72  08/17/16 126/84     Review of Systems  Constitutional: Negative for unexpected weight change.  HENT: Negative for rhinorrhea.   Eyes: Negative for visual disturbance.  Respiratory: Negative for cough and shortness of breath.   Cardiovascular: Negative for chest pain.  Gastrointestinal: Negative for constipation and diarrhea.  Genitourinary: Negative for difficulty urinating.  Musculoskeletal: Negative for arthralgias and myalgias.  Skin: Negative for rash.  Allergic/Immunologic: Negative for environmental allergies.  Neurological: Negative for dizziness, numbness and headaches.  Psychiatric/Behavioral: The patient is not nervous/anxious.         Past Medical History:  Diagnosis Date  . Chickenpox   . GERD (gastroesophageal reflux disease)   . Hyperlipemia   . Neck mass    removed in 2012, benign  . Prediabetes   . Pyloric stenosis      Social History   Socioeconomic History  . Marital status: Married    Spouse name: Not on file  . Number of children: Not on file  . Years of education: Not on file  . Highest education level: Not on file  Occupational History  . Occupation: Therapist, nutritional  Tobacco Use  . Smoking status: Never Smoker  . Smokeless tobacco: Never Used  Vaping Use  . Vaping Use: Never used  Substance and Sexual Activity  . Alcohol use: Yes    Alcohol/week: 0.0 standard drinks    Comment: social  . Drug use: No  . Sexual activity: Yes  Other Topics Concern  . Not on file  Social History Narrative   Married.   No children.   Works as a Therapist, nutritional.   Enjoys riding his motorcycle, spending time with pets and family, car maintenance, playing video games.   Social Determinants of Health   Financial Resource Strain: Not on file  Food Insecurity: Not on file  Transportation Needs: Not on file  Physical Activity: Not on file  Stress: Not on file  Social Connections: Not on file  Intimate Partner Violence: Not on file    Past Surgical History:  Procedure Laterality Date  . NASAL SEPTUM SURGERY    . VASECTOMY      Family History  Problem Relation Age of Onset  .  Hyperlipidemia Mother   . Hypertension Mother   . Arthritis Father   . Hyperlipidemia Father   . Hypertension Father   . Colon cancer Maternal Uncle   . Arthritis Maternal Grandmother   . Hyperlipidemia Maternal Grandmother   . Heart disease Maternal Grandmother   . Hypertension Maternal Grandmother   . Stroke Maternal Grandfather   . Hypertension Maternal Grandfather   . Alcohol abuse Paternal Grandfather     No Known Allergies  Current Outpatient Medications on File Prior to Visit  Medication Sig Dispense  Refill  . b complex vitamins capsule Take 1 capsule by mouth daily.    . Cholecalciferol (VITAMIN D3) 5000 units CAPS Take 5,000 Units by mouth daily.    Javier Docker Oil 500 MG CAPS Take 500 mg by mouth daily.    . Multiple Vitamin (MULTIVITAMIN) tablet Take 1 tablet by mouth daily.    Marland Kitchen oseltamivir (TAMIFLU) 75 MG capsule Take 1 capsule (75 mg total) by mouth 2 (two) times daily. 10 capsule 0   No current facility-administered medications on file prior to visit.    BP 124/82   Pulse 81   Temp 97.8 F (36.6 C) (Temporal)   Ht 5\' 8"  (1.727 m)   Wt 189 lb 1.9 oz (85.8 kg)   SpO2 100%   BMI 28.76 kg/m    Objective:   Physical Exam Constitutional:      Appearance: He is well-nourished.  HENT:     Right Ear: Tympanic membrane and ear canal normal.     Left Ear: Tympanic membrane and ear canal normal.     Mouth/Throat:     Mouth: Oropharynx is clear and moist.  Eyes:     Extraocular Movements: EOM normal.     Pupils: Pupils are equal, round, and reactive to light.  Cardiovascular:     Rate and Rhythm: Normal rate and regular rhythm.  Pulmonary:     Effort: Pulmonary effort is normal.     Breath sounds: Normal breath sounds.  Abdominal:     General: Bowel sounds are normal.     Palpations: Abdomen is soft.     Tenderness: There is no abdominal tenderness.  Musculoskeletal:        General: Normal range of motion.     Cervical back: Neck supple.  Skin:    General: Skin is warm and dry.  Neurological:     Mental Status: He is alert and oriented to person, place, and time.     Cranial Nerves: No cranial nerve deficit.     Deep Tendon Reflexes:     Reflex Scores:      Patellar reflexes are 2+ on the right side and 2+ on the left side. Psychiatric:        Mood and Affect: Mood and affect and mood normal.            Assessment & Plan:

## 2020-06-24 NOTE — Patient Instructions (Addendum)
Stop by the lab prior to leaving today. I will notify you of your results once received.   Continue exercising. You should be getting 150 minutes of moderate intensity exercise weekly.  Continue to work on a healthy diet. Ensure you are consuming 64 ounces of water daily.  It was a pleasure to see you today!   Preventive Care 40-53 Years Old, Male Preventive care refers to lifestyle choices and visits with your health care provider that can promote health and wellness. This includes:  A yearly physical exam. This is also called an annual wellness visit.  Regular dental and eye exams.  Immunizations.  Screening for certain conditions.  Healthy lifestyle choices, such as: ? Eating a healthy diet. ? Getting regular exercise. ? Not using drugs or products that contain nicotine and tobacco. ? Limiting alcohol use. What can I expect for my preventive care visit? Physical exam Your health care provider will check your:  Height and weight. These may be used to calculate your BMI (body mass index). BMI is a measurement that tells if you are at a healthy weight.  Heart rate and blood pressure.  Body temperature.  Skin for abnormal spots. Counseling Your health care provider may ask you questions about your:  Past medical problems.  Family's medical history.  Alcohol, tobacco, and drug use.  Emotional well-being.  Home life and relationship well-being.  Sexual activity.  Diet, exercise, and sleep habits.  Work and work environment.  Access to firearms. What immunizations do I need? Vaccines are usually given at various ages, according to a schedule. Your health care provider will recommend vaccines for you based on your age, medical history, and lifestyle or other factors, such as travel or where you work.   What tests do I need? Blood tests  Lipid and cholesterol levels. These may be checked every 5 years, or more often if you are over 50 years old.  Hepatitis C  test.  Hepatitis B test. Screening  Lung cancer screening. You may have this screening every year starting at age 55 if you have a 30-pack-year history of smoking and currently smoke or have quit within the past 15 years.  Prostate cancer screening. Recommendations will vary depending on your family history and other risks.  Genital exam to check for testicular cancer or hernias.  Colorectal cancer screening. ? All adults should have this screening starting at age 50 and continuing until age 75. ? Your health care provider may recommend screening at age 45 if you are at increased risk. ? You will have tests every 1-10 years, depending on your results and the type of screening test.  Diabetes screening. ? This is done by checking your blood sugar (glucose) after you have not eaten for a while (fasting). ? You may have this done every 1-3 years.  STD (sexually transmitted disease) testing, if you are at risk. Follow these instructions at home: Eating and drinking  Eat a diet that includes fresh fruits and vegetables, whole grains, lean protein, and low-fat dairy products.  Take vitamin and mineral supplements as recommended by your health care provider.  Do not drink alcohol if your health care provider tells you not to drink.  If you drink alcohol: ? Limit how much you have to 0-2 drinks a day. ? Be aware of how much alcohol is in your drink. In the [place] one drink equals one 12 oz bottle of beer (355 mL), one 5 oz glass of wine (148 mL), or one   1 oz glass of hard liquor (44 mL).   Lifestyle  Take daily care of your teeth and gums. Brush your teeth every morning and night with fluoride toothpaste. Floss one time each day.  Stay active. Exercise for at least 30 minutes 5 or more days each week.  Do not use any products that contain nicotine or tobacco, such as cigarettes, e-cigarettes, and chewing tobacco. If you need help quitting, ask your health care provider.  Do not use  drugs.  If you are sexually active, practice safe sex. Use a condom or other form of protection to prevent STIs (sexually transmitted infections).  If told by your health care provider, take low-dose aspirin daily starting at age 50.  Find healthy ways to cope with stress, such as: ? Meditation, yoga, or listening to music. ? Journaling. ? Talking to a trusted person. ? Spending time with friends and family. Safety  Always wear your seat belt while driving or riding in a vehicle.  Do not drive: ? If you have been drinking alcohol. Do not ride with someone who has been drinking. ? When you are tired or distracted. ? While texting.  Wear a helmet and other protective equipment during sports activities.  If you have firearms in your house, make sure you follow all gun safety procedures. What's next?  Go to your health care provider once a year for an annual wellness visit.  Ask your health care provider how often you should have your eyes and teeth checked.  Stay up to date on all vaccines. This information is not intended to replace advice given to you by your health care provider. Make sure you discuss any questions you have with your health care provider. Document Revised: 02/06/2019 Document Reviewed: 05/04/2018 Elsevier Patient Education  2021 [employer]vier Inc.  

## 2020-06-24 NOTE — Assessment & Plan Note (Signed)
Shingrix and flu vaccine provided today. PSA due and pending. Colonoscopy UTD, due in 2027. Commended him on a healthy lifestyle.  Exam today unremarkable. Labs pending.

## 2020-06-24 NOTE — Assessment & Plan Note (Signed)
Normal reading on exam today, also normal home readings over the last 4 months.

## 2020-06-24 NOTE — Assessment & Plan Note (Signed)
Commended him on a healthy diet and regular exercise, repeat A1C pending today.

## 2021-01-02 ENCOUNTER — Encounter: Payer: Self-pay | Admitting: Nurse Practitioner

## 2021-01-02 ENCOUNTER — Ambulatory Visit: Payer: BC Managed Care – PPO | Admitting: Nurse Practitioner

## 2021-01-02 ENCOUNTER — Other Ambulatory Visit: Payer: Self-pay

## 2021-01-02 VITALS — BP 130/94 | HR 84 | Temp 97.8°F | Resp 12 | Ht 68.0 in | Wt 197.5 lb

## 2021-01-02 DIAGNOSIS — R053 Chronic cough: Secondary | ICD-10-CM | POA: Insufficient documentation

## 2021-01-02 DIAGNOSIS — R030 Elevated blood-pressure reading, without diagnosis of hypertension: Secondary | ICD-10-CM | POA: Diagnosis not present

## 2021-01-02 DIAGNOSIS — Z23 Encounter for immunization: Secondary | ICD-10-CM | POA: Diagnosis not present

## 2021-01-02 DIAGNOSIS — R0789 Other chest pain: Secondary | ICD-10-CM | POA: Insufficient documentation

## 2021-01-02 HISTORY — DX: Other chest pain: R07.89

## 2021-01-02 NOTE — Progress Notes (Signed)
Acute Office Visit  Subjective:    Patient ID: Nicholas Arnold, male    DOB: [birth year], 55 y.o.   MRN: [id number]  Chief Complaint  Patient presents with   Cough    Several weeks, productive with clear phlegm and at times light green color. Cough has become less frequent this week. Cough seems to be worse in the evenings then mornings. Some post nasal drip. Some sneezing, some nasal congestion. Covid home test x 2 last week were negative. No fever. Has taking OTC Nyquil and day time medications, last time he took anything was about a week ago.     Patient is in today for cough that has been going on for around 4-5 weeks. States that it has improved. States it is productive. Not thick, light color. Has had episodes of green sputum.  Elevated BP in office. Has a way to check BP at home. Has not checked it lately.  States that he use to wake up every morning to walk 5 miles 5 times a week. Recently adopted 2 puppies and has changed his routine. Planing on starting back. Does walk the dogs every morning for approx 20 mins.  Chest discomfort. Right sided that goes through the back. Cannot describe the quality of pain. Has been happening approx 2 weeks. Not sure if is musculoskeletal or not. Recently added a new puppy to the household. Cannot elicit pain with movement or palpation    Past Medical History:  Diagnosis Date   Chickenpox    GERD (gastroesophageal reflux disease)    Hyperlipemia    Neck mass    removed in 2012, benign   Prediabetes    Pyloric stenosis     Past Surgical History:  Procedure Laterality Date   NASAL SEPTUM SURGERY     VASECTOMY      Family History  Problem Relation Age of Onset   Hyperlipidemia Mother    Hypertension Mother    Arthritis Father    Hyperlipidemia Father    Hypertension Father    Colon cancer Maternal Uncle    Arthritis Maternal Grandmother    Hyperlipidemia Maternal Grandmother    Heart disease Maternal Grandmother    Hypertension  Maternal Grandmother    Stroke Maternal Grandfather    Hypertension Maternal Grandfather    Alcohol abuse Paternal Grandfather     Social History   Socioeconomic History   Marital status: Married    Spouse name: Not on file   Number of children: Not on file   Years of education: Not on file   Highest education level: Not on file  Occupational History   Occupation: Therapist, nutritional  Tobacco Use   Smoking status: Never   Smokeless tobacco: Never  Vaping Use   Vaping Use: Never used  Substance and Sexual Activity   Alcohol use: Yes    Alcohol/week: 0.0 standard drinks    Comment: social   Drug use: No   Sexual activity: Yes  Other Topics Concern   Not on file  Social History Narrative   Married.   No children.   Works as a Therapist, nutritional.   Enjoys riding his motorcycle, spending time with pets and family, car maintenance, playing video games.   Social Determinants of Health   Financial Resource Strain: Not on file  Food Insecurity: Not on file  Transportation Needs: Not on file  Physical Activity: Not on file  Stress: Not on file  Social Connections: Not on file  Intimate Partner Violence:  Not on file    No outpatient medications prior to visit.   No facility-administered medications prior to visit.    No Known Allergies  Review of Systems  Constitutional:  Negative for chills and fever.  HENT:  Negative for sinus pressure and sinus pain.   Respiratory:  Positive for cough. Negative for shortness of breath.   Cardiovascular:  Positive for chest pain. Negative for palpitations and leg swelling.  Gastrointestinal:  Negative for diarrhea, nausea and rectal pain.  Skin:  Negative for color change and pallor.      Objective:    Physical Exam Vitals and nursing note reviewed.  Constitutional:      Appearance: Normal appearance.  Cardiovascular:     Rate and Rhythm: Normal rate and regular rhythm.     Heart sounds: No murmur heard.    Comments: Chest non  tender to palpation  Pulmonary:     Effort: Pulmonary effort is normal.     Breath sounds: Normal breath sounds.  Abdominal:     General: Bowel sounds are normal.  Neurological:     Mental Status: He is alert.  Psychiatric:        Mood and Affect: Mood normal.        Behavior: Behavior normal.        Thought Content: Thought content normal.        Judgment: Judgment normal.    BP (!) 142/100   Pulse 84   Temp 97.8 F (36.6 C)   Resp 12   Ht '5\' 8"'$  (1.727 m)   Wt 197 lb 8 oz (89.6 kg)   SpO2 95%   BMI 30.03 kg/m  Wt Readings from Last 3 Encounters:  01/02/21 197 lb 8 oz (89.6 kg)  06/24/20 189 lb 1.9 oz (85.8 kg)  06/10/17 186 lb 8 oz (84.6 kg)    Health Maintenance Due  Topic Date Due   HIV Screening  Never done   Hepatitis C Screening  Never done   COVID-19 Vaccine (2 - Booster for Janssen series) 09/28/2019   Zoster Vaccines- Shingrix (2 of 2) 08/19/2020   INFLUENZA VACCINE  12/22/2020    There are no preventive care reminders to display for this patient.   Lab Results  Component Value Date   TSH 1.73 03/12/2015   Lab Results  Component Value Date   WBC 4.6 06/24/2020   HGB 14.7 06/24/2020   HCT 42.5 06/24/2020   MCV 84.2 06/24/2020   PLT 234.0 06/24/2020   Lab Results  Component Value Date   NA 140 06/24/2020   K 4.1 06/24/2020   CO2 29 06/24/2020   GLUCOSE 99 06/24/2020   BUN 19 06/24/2020   CREATININE 1.00 06/24/2020   BILITOT 0.5 06/24/2020   ALKPHOS 41 06/24/2020   AST 22 06/24/2020   ALT 30 06/24/2020   PROT 6.8 06/24/2020   ALBUMIN 4.5 06/24/2020   CALCIUM 9.7 06/24/2020   GFR 86.63 06/24/2020   Lab Results  Component Value Date   CHOL 208 (H) 06/24/2020   Lab Results  Component Value Date   HDL 46.40 06/24/2020   Lab Results  Component Value Date   LDLCALC 142 (H) 06/24/2020   Lab Results  Component Value Date   TRIG 98.0 06/24/2020   Lab Results  Component Value Date   CHOLHDL 4 06/24/2020   Lab Results  Component  Value Date   HGBA1C 5.7 06/24/2020       Assessment & Plan:   Problem List  Items Addressed This Visit       Other   Elevated blood pressure reading    Has the ability to to check blood pressures at home.  States as of late he has not done it.  Encourage patient to check blood pressure 3 times a week record and send via [place].  Patient acknowledged. Did recheck blood pressure in office came down minimally.      Persistent cough - Primary    Patient with persistent cough.  Does have a history of pneumonia back in 2007.  Had x-ray to diagnose pneumonia follow-up x-ray still showed airspace disease.  Patient did not have any further follow-up.  Cough lasting 5 weeks we will pursue outpatient chest x-ray.  Order placed, pending results.      Relevant Orders   DG Chest 2 View   Chest discomfort    Unsure of etiology.  Patient did have elevated blood pressure in office.  EKG in office normal sinus rhythm no acute concerns.  Patient is going to continue working on his exercise to get blood pressure under adequate control.  Pending chest x-ray      Relevant Orders   EKG 12-Lead (Completed)   DG Chest 2 View   Other Visit Diagnoses     Elevated BP without diagnosis of hypertension       Relevant Orders   EKG 12-Lead (Completed)   Need for shingles vaccine       Relevant Orders   Varicella-zoster vaccine IM (Shingrix)        No orders of the defined types were placed in this encounter.  This visit occurred during the SARS-CoV-2 public health emergency.  Safety protocols were in place, including screening questions prior to the visit, additional usage of staff PPE, and extensive cleaning of exam room while observing appropriate contact time as indicated for disinfecting solutions.   Romilda Garret, NP

## 2021-01-02 NOTE — Assessment & Plan Note (Addendum)
Has the ability to to check blood pressures at home.  States as of late he has not done it.  Encourage patient to check blood pressure 3 times a week record and send via [place].  Patient acknowledged. Did recheck blood pressure in office came down minimally.

## 2021-01-02 NOTE — Assessment & Plan Note (Signed)
Unsure of etiology.  Patient did have elevated blood pressure in office.  EKG in office normal sinus rhythm no acute concerns.  Patient is going to continue working on his exercise to get blood pressure under adequate control.  Pending chest x-ray

## 2021-01-02 NOTE — Patient Instructions (Signed)
Pleasure seeing you today. Get the xray done and I will be in touch most likely on Monday with you Call the office or send a my chart message with any questions or concerns Check your blood pressure at home 3 times a week and send it to me on My Chart

## 2021-01-02 NOTE — Assessment & Plan Note (Signed)
Patient with persistent cough.  Does have a history of pneumonia back in 2007.  Had x-ray to diagnose pneumonia follow-up x-ray still showed airspace disease.  Patient did not have any further follow-up.  Cough lasting 5 weeks we will pursue outpatient chest x-ray.  Order placed, pending results.

## 2021-01-04 ENCOUNTER — Ambulatory Visit
Admission: RE | Admit: 2021-01-04 | Discharge: 2021-01-04 | Disposition: A | Discharge status: Home / Self Care | Payer: BC Managed Care – PPO | Source: Ambulatory Visit | Attending: Nurse Practitioner | Admitting: Nurse Practitioner

## 2021-01-04 ENCOUNTER — Ambulatory Visit
Admission: RE | Admit: 2021-01-04 | Discharge: 2021-01-04 | Disposition: A | Discharge status: Home / Self Care | Payer: BC Managed Care – PPO | Attending: *Deleted | Admitting: *Deleted

## 2021-01-04 DIAGNOSIS — R0789 Other chest pain: Secondary | ICD-10-CM | POA: Diagnosis not present

## 2021-01-04 DIAGNOSIS — R053 Chronic cough: Secondary | ICD-10-CM | POA: Diagnosis not present

## 2021-01-04 DIAGNOSIS — R059 Cough, unspecified: Secondary | ICD-10-CM | POA: Diagnosis not present

## 2022-08-05 DIAGNOSIS — R0681 Apnea, not elsewhere classified: Secondary | ICD-10-CM | POA: Diagnosis not present

## 2022-08-06 DIAGNOSIS — R0681 Apnea, not elsewhere classified: Secondary | ICD-10-CM | POA: Diagnosis not present

## 2023-01-23 DIAGNOSIS — I214 Non-ST elevation (NSTEMI) myocardial infarction: Secondary | ICD-10-CM | POA: Insufficient documentation

## 2023-01-23 DIAGNOSIS — I4519 Other right bundle-branch block: Secondary | ICD-10-CM | POA: Diagnosis not present

## 2023-01-23 DIAGNOSIS — R9431 Abnormal electrocardiogram [ECG] [EKG]: Secondary | ICD-10-CM | POA: Diagnosis not present

## 2023-01-23 DIAGNOSIS — G4733 Obstructive sleep apnea (adult) (pediatric): Secondary | ICD-10-CM | POA: Diagnosis not present

## 2023-01-23 DIAGNOSIS — E785 Hyperlipidemia, unspecified: Secondary | ICD-10-CM | POA: Diagnosis not present

## 2023-01-23 DIAGNOSIS — N181 Chronic kidney disease, stage 1: Secondary | ICD-10-CM | POA: Diagnosis not present

## 2023-01-23 DIAGNOSIS — I10 Essential (primary) hypertension: Secondary | ICD-10-CM | POA: Diagnosis not present

## 2023-01-23 DIAGNOSIS — I21A1 Myocardial infarction type 2: Secondary | ICD-10-CM | POA: Diagnosis not present

## 2023-01-23 DIAGNOSIS — I491 Atrial premature depolarization: Secondary | ICD-10-CM | POA: Diagnosis not present

## 2023-01-23 DIAGNOSIS — R7989 Other specified abnormal findings of blood chemistry: Secondary | ICD-10-CM | POA: Diagnosis not present

## 2023-01-23 DIAGNOSIS — R079 Chest pain, unspecified: Secondary | ICD-10-CM | POA: Diagnosis not present

## 2023-01-23 DIAGNOSIS — Z0189 Encounter for other specified special examinations: Secondary | ICD-10-CM | POA: Diagnosis not present

## 2023-01-23 DIAGNOSIS — I129 Hypertensive chronic kidney disease with stage 1 through stage 4 chronic kidney disease, or unspecified chronic kidney disease: Secondary | ICD-10-CM | POA: Diagnosis not present

## 2023-01-23 DIAGNOSIS — R0602 Shortness of breath: Secondary | ICD-10-CM | POA: Diagnosis not present

## 2023-01-23 DIAGNOSIS — Z8249 Family history of ischemic heart disease and other diseases of the circulatory system: Secondary | ICD-10-CM | POA: Diagnosis not present

## 2023-01-23 DIAGNOSIS — I2542 Coronary artery dissection: Secondary | ICD-10-CM | POA: Diagnosis not present

## 2023-01-23 DIAGNOSIS — I4891 Unspecified atrial fibrillation: Secondary | ICD-10-CM | POA: Diagnosis not present

## 2023-01-23 DIAGNOSIS — I480 Paroxysmal atrial fibrillation: Secondary | ICD-10-CM | POA: Diagnosis not present

## 2023-01-23 DIAGNOSIS — R55 Syncope and collapse: Secondary | ICD-10-CM | POA: Diagnosis not present

## 2023-01-23 DIAGNOSIS — R569 Unspecified convulsions: Secondary | ICD-10-CM | POA: Diagnosis not present

## 2023-01-23 DIAGNOSIS — D72829 Elevated white blood cell count, unspecified: Secondary | ICD-10-CM | POA: Diagnosis not present

## 2023-01-23 DIAGNOSIS — R0789 Other chest pain: Secondary | ICD-10-CM | POA: Diagnosis not present

## 2023-01-23 DIAGNOSIS — I2510 Atherosclerotic heart disease of native coronary artery without angina pectoris: Secondary | ICD-10-CM | POA: Diagnosis not present

## 2023-01-23 DIAGNOSIS — R778 Other specified abnormalities of plasma proteins: Secondary | ICD-10-CM | POA: Diagnosis not present

## 2023-01-23 DIAGNOSIS — E71312 Short chain acyl CoA dehydrogenase deficiency: Secondary | ICD-10-CM | POA: Diagnosis not present

## 2023-01-23 DIAGNOSIS — G4730 Sleep apnea, unspecified: Secondary | ICD-10-CM | POA: Diagnosis not present

## 2023-01-23 DIAGNOSIS — E876 Hypokalemia: Secondary | ICD-10-CM | POA: Diagnosis not present

## 2023-01-23 HISTORY — DX: Non-ST elevation (NSTEMI) myocardial infarction: I21.4

## 2023-01-24 DIAGNOSIS — G4730 Sleep apnea, unspecified: Secondary | ICD-10-CM | POA: Insufficient documentation

## 2023-01-24 DIAGNOSIS — I10 Essential (primary) hypertension: Secondary | ICD-10-CM | POA: Insufficient documentation

## 2023-01-24 DIAGNOSIS — I480 Paroxysmal atrial fibrillation: Secondary | ICD-10-CM | POA: Insufficient documentation

## 2023-01-24 HISTORY — PX: OTHER SURGICAL HISTORY: SHX169

## 2023-01-25 DIAGNOSIS — R55 Syncope and collapse: Secondary | ICD-10-CM | POA: Diagnosis not present

## 2023-01-27 ENCOUNTER — Telehealth: Payer: Self-pay

## 2023-01-27 ENCOUNTER — Other Ambulatory Visit: Payer: Self-pay | Admitting: *Deleted

## 2023-01-27 DIAGNOSIS — I21A1 Myocardial infarction type 2: Secondary | ICD-10-CM

## 2023-01-27 NOTE — Transitions of Care (Post Inpatient/ED Visit) (Signed)
01/27/2023  Name: Nicholas Arnold MRN: [id number] DOB: [birth year]  Today's TOC FU Call Status: Today's TOC FU Call Status:: Successful TOC FU Call Completed TOC FU Call Complete Date: 01/27/23 Patient's Name and Date of Birth confirmed.  Transition Care Management Follow-up Telephone Call Date of Discharge: 01/25/23 Discharge Facility: Other [facility]) Name of Other (Non-[facility]) Discharge Facility: [facility] Type of Discharge: Inpatient Admission Primary Inpatient Discharge Diagnosis:: "spontaneous dissection of cornary artery" How have you been since you were released from the hospital?: Better (pt pleased to reprot how well he has been doing sicne returning home and aware he is fortunate given all he has been through-eating & sleeping well-up walking but does become 'fatigued sooner than normal") Any questions or concerns?: Yes Patient Questions/Concerns:: Pt anting to discuss referal to cardiologist who can see him sooner with PCP as current appt with cardiology is not until Nov. Patient Questions/Concerns Addressed: Other: (pt has already sent mychart message to provider. Advised pt that message had been routed to PCP and provider will respond when she has a chance)  Items Reviewed: Did you receive and understand the discharge instructions provided?: Yes Medications obtained,verified, and reconciled?: Yes (Medications Reviewed) (pt taking new meds ordered at discharge-states he has not resumed his vitamin/ssupplements that he was taking prior to admission yet) Any new allergies since your discharge?: No Dietary orders reviewed?: Yes Type of Diet Ordered:: low salt/heart healthy Do you have support at home?: Yes People in Home: spouse Name of Support/Comfort Primary Source: Amy  Medications Reviewed Today: Medications Reviewed Today     Reviewed by Charlyn Minerva, RN (Registered Nurse) on 01/27/23 at 1241  Med List Status: <None>    Medication Order Taking? Sig Documenting Provider Last Dose Status Informant  ascorbic acid (VITAMIN C) 250 MG CHEW [id number] Yes Chew 2 tablets by mouth daily. [provider]  Active Self  aspirin EC 81 MG tablet [id number] Yes Take 81 mg by mouth daily. Swallow whole. [provider] Taking Active Self  atorvastatin (LIPITOR) 80 MG tablet [id number] Yes Take 80 mg by mouth daily. [provider] Taking Active Self  Cholecalciferol 10 MCG (400 UNIT) CHEW [id number] Yes Chew 2 tablets by mouth daily. [provider]  Active Self  metoprolol tartrate (LOPRESSOR) 25 MG tablet [id number] Yes Take 25 mg by mouth 2 (two) times daily. take 1 tablet in the morning and 1 tablet before bedtime by mouth. [provider] Taking Active Self            Home Care and Equipment/Supplies: Were Home Health Services Ordered?: NA (pt has been referred to cardiac rehab at [facility] is aware that they should contact him in the next few days) Any new equipment or medical supplies ordered?: NA  Functional Questionnaire: Do you need assistance with bathing/showering or dressing?: No Do you need assistance with meal preparation?: No Do you need assistance with eating?: No Do you have difficulty maintaining continence: No Do you need assistance with getting out of bed/getting out of a chair/moving?: No Do you have difficulty managing or taking your medications?: No  Follow up appointments reviewed: PCP Follow-up appointment confirmed?: Yes Date of PCP follow-up appointment?: 02/02/23 Follow-up Provider: Hermenia Fiscal Follow-up appointment confirmed?: Yes Date of Specialist follow-up appointment?: 04/07/23 Follow-Up Specialty Provider:: Dr. Okey Dupre Do you need transportation to your follow-up appointment?: No (discussed with pt not driving until medically cleared to resume driving-he voiced understanding and confirms he has someone to take  him  to appts) Do you understand care options if your condition(s) worsen?: Yes-patient verbalized understanding  SDOH Interventions Today    Flowsheet Row Most Recent Value  SDOH Interventions   Food Insecurity Interventions Intervention Not Indicated      TOC Interventions Today    Flowsheet Row Most Recent Value  TOC Interventions   TOC Interventions Discussed/Reviewed TOC Interventions Discussed      Interventions Today    Flowsheet Row Most Recent Value  Chronic Disease   Chronic disease during today's visit Other  [NSTEMI]  General Interventions   General Interventions Discussed/Reviewed General Interventions Discussed, Durable Medical Equipment (DME)  Durable Medical Equipment (DME) BP Cuff, Other  [pt wearing 30 day heart monitor, he has BP machine in the home-checking BP daily, BP today 108/67, HR-6, pulse ox sats -95-97%]  Exercise Interventions   Exercise Discussed/Reviewed Exercise Discussed  [pt has been referred to cardiac rehab at [facility] prior to admission he was using treadmill daily-wanting to know when to resume-advised pt to wait until seen by MD - and medically cleared to resume exercise]  Education Interventions   Education Provided Provided Education  Provided Verbal Education On When to see the doctor, Medication, Nutrition  Nutrition Interventions   Nutrition Discussed/Reviewed Nutrition Discussed, Adding fruits and vegetables, Increasing proteins, Decreasing fats, Fluid intake, Decreasing salt  Pharmacy Interventions   Pharmacy Dicussed/Reviewed Pharmacy Topics Discussed, Medications and their functions  Safety Interventions   Safety Discussed/Reviewed Safety Discussed       Alessandra Grout [facility] Care Management Care Management Community Coordinator Direct Phone: [phone] Toll Free: [phone] Fax: [phone]

## 2023-01-28 DIAGNOSIS — I2542 Coronary artery dissection: Secondary | ICD-10-CM | POA: Insufficient documentation

## 2023-01-28 NOTE — Progress Notes (Unsigned)
Cardiology Office Note  Date:  01/31/2023   ID:  Nicholas Arnold, DOB 1966-05-20, MRN [id number]  PCP:  Nicholas Nest, NP   Chief Complaint  Patient presents with   New Patient (Initial Visit)    Ref by Nicholas Rieger, NP to establish care for CAD/NSTEMI. "Doing well." Medications reviewed by the patient verbally.      HPI:  Nicholas Arnold is a 57 year old gentleman with past medical history of Syncopal episode Spontaneous dissection of coronary artery Who presents by self referral for recent non-STEMI, coronary dissection  [place] [facility] [place] when he reports feeling that he overdid it;  Able to get up on the wakeboard for over 2 minutes before feeling exhausted, getting back into the boat 1 hour later became extremely weak, short of breath, chest tightness, slumped over and had what sounds like agonal breathing and questionable upper extremity shaking. His lips were reportedly purple. Friends tried to get him on his back and in the process he firmly fell on the deck of the boat, causing him to come to. He was reportedly unconscious for 1-2 minutes. He had no loss of bowel/bladder or tongue biting. It took him 5-10 minutes before he felt back to baseline, denies any confusion upon coming to. Did have 1 alcoholic drink  typically drinks 5-6 beers a day per the notes. No tobacco use, no recreational drug use.   Family history notable for valve disease and heart failure and a maternal grandparent; no history of sudden cardiac death or drowning. Does not take any prescribed medications, takes over-the-counter vitamins.  EMS was called and brought the patient to the emergency room. Some of the rhythm strips via EMS appear to show an irregularly irregular rhythm with no P wave morphology suggestive atrial fibrillation while others show sinus rhythm. Upon arrival to the emergency room the patient was afebrile and hemodynamically  stable.  Labs notable for troponin 13,559, potassium 3.2, AST 121, undetectable alcohol, leukocytosis at 15.4. EKG showed sinus rhythm without changes suggestive of ischemia. CTA chest obtained and showed no evidence of PE and just a 5 mm LUL nodule. Patient was loaded with aspirin, started on heparin and cardiology was consulted for admission."  Nicholas Arnold was admitted to cardiology for NSTEMI. High-sensitivity troponin peaked 80,518 and EKG with inferior T wave inversion.   cardiac cath which noted 50% stenosis of the RPAV branch with scad type III which is likely the culprit lesion.    cardiac MRI inferior infarct findings on cath. Given possible seizure-like activity during syncopal episode, neurology was consulted. It was felt that shaking behavior was likely convulsive syncope raising question for arrhythmia. Could consider EEG as an outpatient if further concern arises. He was monitored for 48 hours with no evidence of arrhythmia on telemetry. With EMS there was some question of possible atrial fibrillation, however this was not demonstrated during admission. At this time, he appears stable for discharge with 30-day ACT monitor. Patient is to follow-up with PCP in 1 week and is planning to establish cardiology follow-up closer to home in [place].   Cardiac MRI January 24, 2023 Consistent with myocardial infarction involving the inferior wall  1.  Low normal left ventricular systolic function.  Left ventricular EF is 54%.  Mild inferior wall hypokinesis  2.  Low normal right ventricular systolic function.  Right ventricular EF is 53%.  3.  There is small subendocardial late gadolinium enhancement involving  the mid inferior wall consistent with infarction in the right coronary artery territory (probably viable, <25 % wall thickness. <1% LV mass)  4.  There is prolonged T2 time in the inferior wall consistent with acute myocardial edema.    5.  There is no evidence of intracavitary  thrombus  6.  There is no significant pericardial effusion  7.  No significant valve disease    EKG personally reviewed by myself on todays visit EKG Interpretation Date/Time:  Monday January 31 2023 14:06:12 EDT Ventricular Rate:  59 PR Interval:  148 QRS Duration:  88 QT Interval:  408 QTC Calculation: 403 R Axis:   -42  Text Interpretation: Sinus bradycardia Left axis deviation Minimal voltage criteria for LVH, may be normal variant ( R in aVL ) Inferior infarct , age undetermined When compared with ECG of 05-Jan-2011 14:52, QRS axis Shifted left Inferior infarct is now Present T wave inversion now evident in Inferior leads Confirmed by Julien Nordmann [phone]) on 01/31/2023 2:16:13 PM    PMH:   has a past medical history of Chickenpox, GERD (gastroesophageal reflux disease), Hyperlipemia, Neck mass, Prediabetes, and Pyloric stenosis.  PSH:    Past Surgical History:  Procedure Laterality Date   NASAL SEPTUM SURGERY     VASECTOMY      Current Outpatient Medications  Medication Sig Dispense Refill   ascorbic acid (VITAMIN C) 250 MG CHEW Chew 2 tablets by mouth daily.     aspirin EC 81 MG tablet Take 81 mg by mouth daily. Swallow whole.     atorvastatin (LIPITOR) 80 MG tablet Take 80 mg by mouth daily.     Cholecalciferol 10 MCG (400 UNIT) CHEW Chew 2 tablets by mouth daily.     metoprolol tartrate (LOPRESSOR) 25 MG tablet Take 25 mg by mouth 2 (two) times daily. take 1 tablet in the morning and 1 tablet before bedtime by mouth.     No current facility-administered medications for this visit.    Allergies:   Bee venom   Social History:  The patient  reports that he has never smoked. He has never used smokeless tobacco. He reports current alcohol use. He reports that he does not use drugs.   Family History:   family history includes Alcohol abuse in his paternal grandfather; Arthritis in his father and maternal grandmother; Colon cancer in his maternal uncle; Heart disease in  his maternal grandmother; Hyperlipidemia in his father, maternal grandmother, and mother; Hypertension in his brother, father, maternal grandfather, maternal grandmother, and mother; Hypotension in his brother; Stroke in his maternal grandfather.    Review of Systems: Review of Systems  Constitutional: Negative.   HENT: Negative.    Respiratory: Negative.    Cardiovascular: Negative.   Gastrointestinal: Negative.   Musculoskeletal: Negative.   Neurological: Negative.   Psychiatric/Behavioral: Negative.    All other systems reviewed and are negative.    PHYSICAL EXAM: VS:  BP 118/62 (BP Location: Right Arm, Patient Position: Sitting, Cuff Size: Normal)   Pulse (!) 59   Ht 5\' 7"  (1.702 m)   Wt 188 lb 4 oz (85.4 kg)   SpO2 98%   BMI 29.48 kg/m  , BMI Body mass index is 29.48 kg/m. GEN: Well nourished, well developed, in no acute distress HEENT: normal Neck: no JVD, carotid bruits, or masses Cardiac: RRR; no murmurs, rubs, or gallops,no edema  Respiratory:  clear to auscultation bilaterally, normal work of breathing GI: soft, nontender, nondistended, + BS MS: no deformity or atrophy  Skin: warm and dry, no rash Neuro:  Strength and sensation are intact Psych: euthymic mood, full affect    Recent Labs: No results found for requested labs within last 365 days.    Lipid Panel Lab Results  Component Value Date   CHOL 208 (H) 06/24/2020   HDL 46.40 06/24/2020   LDLCALC 142 (H) 06/24/2020   TRIG 98.0 06/24/2020      Wt Readings from Last 3 Encounters:  01/31/23 188 lb 4 oz (85.4 kg)  01/02/21 197 lb 8 oz (89.6 kg)  06/24/20 189 lb 1.9 oz (85.8 kg)     ASSESSMENT AND PLAN:  Problem List Items Addressed This Visit       Cardiology Problems   Coronary artery dissection - Primary   Relevant Orders   EKG 12-Lead (Completed)   Other Visit Diagnoses     Non-ST elevation (NSTEMI) myocardial infarction [facility])       Relevant Orders   EKG 12-Lead (Completed)       Non-STEMI Coronary dissection of right coronary artery PL branch MRI detailing inferior wall hypokinesis supported by T wave inversions on EKG Cardiac rehab pending Would delay going back to work with slow transition back Continue aspirin, Lipitor, we will change to metoprolol succinate 25 daily for ease of administration  Hyperlipidemia Continue Lipitor 80 daily, goal LDL less than 55  Inferior wall MI Noted on cardiac MRI Ejection for action 50% No room on blood pressure for ACE/ARB event monitor in place, will watch for results Can likely drive to work after results available and we rule out arrhythmia   Total encounter time more than 60 minutes  Greater than 50% was spent in counseling and coordination of care with the patient    Signed, Dossie Arbour, M.D., Ph.D. [facility] [place], [place] [phone]

## 2023-01-31 ENCOUNTER — Encounter: Payer: Self-pay | Admitting: Cardiovascular Disease

## 2023-01-31 ENCOUNTER — Ambulatory Visit: Payer: BC Managed Care – PPO | Attending: Internal Medicine | Admitting: Cardiovascular Disease

## 2023-01-31 VITALS — BP 118/62 | HR 59 | Ht 67.0 in | Wt 188.2 lb

## 2023-01-31 DIAGNOSIS — E782 Mixed hyperlipidemia: Secondary | ICD-10-CM | POA: Diagnosis not present

## 2023-01-31 DIAGNOSIS — I214 Non-ST elevation (NSTEMI) myocardial infarction: Secondary | ICD-10-CM

## 2023-01-31 DIAGNOSIS — I2542 Coronary artery dissection: Secondary | ICD-10-CM | POA: Diagnosis not present

## 2023-01-31 MED ORDER — METOPROLOL SUCCINATE ER 25 MG PO TB24: 100.0000 mg | ORAL_TABLET | Freq: Every day | ORAL | 3 refills | Status: DC

## 2023-01-31 MED ORDER — ATORVASTATIN CALCIUM 80 MG PO TABS: 80.0000 mg | ORAL_TABLET | Freq: Every day | ORAL | 3 refills | Status: DC

## 2023-01-31 NOTE — Patient Instructions (Addendum)
Medication Instructions:  Please hold the metoprolol tartrate Start metoprolol succinate 25 mg daily  If you need a refill on your cardiac medications before your next appointment, please call your pharmacy.   Lab work: No new labs needed  Testing/Procedures: No new testing needed  Follow-Up: At [facility], you and your health needs are our priority.  As part of our continuing mission to provide you with exceptional heart care, we have created designated Provider Care Teams.  These Care Teams include your primary Cardiologist (physician) and Advanced Practice Providers (APPs -  Physician Assistants and Nurse Practitioners) who all work together to provide you with the care you need, when you need it.  You will need a follow up appointment in 6 months  Providers on your designated Care Team:   Nicolasa Ducking, NP Eula Listen, PA-C Cadence Fransico Michael, [place]  COVID-19 Vaccine Information can be found at: [email or web address] For questions related to vaccine distribution or appointments, please email [email or web address] or call [phone].

## 2023-02-02 ENCOUNTER — Ambulatory Visit: Payer: BC Managed Care – PPO | Admitting: Primary Care

## 2023-02-02 ENCOUNTER — Encounter: Payer: Self-pay | Admitting: Primary Care

## 2023-02-02 VITALS — BP 118/82 | HR 65 | Temp 97.9°F | Ht 67.0 in | Wt 185.0 lb

## 2023-02-02 DIAGNOSIS — R7303 Prediabetes: Secondary | ICD-10-CM | POA: Diagnosis not present

## 2023-02-02 DIAGNOSIS — I2542 Coronary artery dissection: Secondary | ICD-10-CM

## 2023-02-02 DIAGNOSIS — E785 Hyperlipidemia, unspecified: Secondary | ICD-10-CM

## 2023-02-02 NOTE — Assessment & Plan Note (Signed)
Recent hospitalization in [place] earlier this month.  Reviewed hospital labs, notes, imaging.   Reviewed cardiology notes from September 2024.   Continue atorvastatin 80 mg daily, aspirin 81 mg daily, metoprolol succinate 25 mg daily.   Repeat lipid panel in 2 months.

## 2023-02-02 NOTE — Assessment & Plan Note (Signed)
Controlled. Reviewed A1C from Care Everywhere in September 2024

## 2023-02-02 NOTE — Assessment & Plan Note (Signed)
Above goal in September 2024, reviewed labs through care everywhere.  Continue atorvastatin 80 mg daily. Repeat lipid panel and LFTs in 2 months.

## 2023-02-02 NOTE — Progress Notes (Signed)
Subjective:    Patient ID: Nicholas Arnold, male    DOB: February 02, 1966, 57 y.o.   MRN: [id number]  HPI  Nicholas Arnold is a very pleasant 57 y.o. male with a history of CAD, carotid artery dissection, hyperlipidemia, prediabetes who presents today for hospital follow-up.  He has not been seen in our office since February 2022.  His wife joins [place] today.  Recent admission for spontaneous dissection of coronary artery.  He was at [facility] on 01/23/2023 wakeboarding with friends when he noticed symptoms of fatigue, shortness of breath, chest tightness. He became extremely weak with agonal breathing, purple lips, fell onto the deck of the boat.  Questionable seizure like activity.  During his stay in the emergency department his troponin level was noted to be 13,559 with potassium 3.2, AST 121, leukocytosis at 15.4.  EKG revealed sinus rhythm without ischemia.  He underwent CTA chest which was negative for PE but did find a 5 mm left upper lobe nodule to the lungs.  During his hospital admission his troponin level peaked at 80,518.  His EKG did reveal inferior T wave inversion.  He underwent cardiac catheterization which revealed 50% stenosis of the RPAV branch with "scad type III".  He underwent cardiac MRI which was consistent for myocardial infarction to the inferior wall.   Evaluated by cardiology on 01/31/2023.  He was changed to metoprolol succinate 25 mg daily for ease of administration.  His Lipitor 80 mg daily and aspirin were continued.  He was not placed on an ACE inhibitor or ARB given softer blood pressure readings.  Today he is feeling fatigued but every day feels better that the day before.  He has been set up with cardiac rehab, should start within the next week. He is currently wearing a 30 day Holter Monitor which was provided during his hospital stay.  Results will come to our office.    He denies chest pain, shortness of breath.  He has noticed intermittent lightheadedness  with positional changes which has improved.   He has changed his diet by increasing vegetables and lean protein. He is cutting back on sweets. He plans to minimize alcohol.   He is compliant to all medications prescribed.  BP Readings from Last 3 Encounters:  02/02/23 118/82  01/31/23 118/62  01/02/21 (!) 130/94     Review of Systems  Constitutional:  Positive for fatigue.  Respiratory:  Negative for shortness of breath.   Cardiovascular:  Negative for chest pain.         Past Medical History:  Diagnosis Date   Chickenpox    GERD (gastroesophageal reflux disease)    Hyperlipemia    Neck mass    removed in 2012, benign   Prediabetes    Pyloric stenosis     Social History   Socioeconomic History   Marital status: Married    Spouse name: Not on file   Number of children: Not on file   Years of education: Not on file   Highest education level: Not on file  Occupational History   Occupation: Financial planner  Tobacco Use   Smoking status: Never   Smokeless tobacco: Never  Vaping Use   Vaping status: Never Used  Substance and Sexual Activity   Alcohol use: Yes    Alcohol/week: 0.0 standard drinks of alcohol    Comment: social   Drug use: No   Sexual activity: Yes  Other Topics Concern   Not on file  Social  History Narrative   Married.   No children.   Works as a Financial planner.   Enjoys riding his motorcycle, spending time with pets and family, car maintenance, playing video games.   Social Determinants of Health   Financial Resource Strain: Low Risk  (01/24/2023)   Received from [facility]   Overall Financial Resource Strain (CARDIA)    Difficulty of Paying Living Expenses: Not hard at all  Food Insecurity: No Food Insecurity (01/27/2023)   Hunger Vital Sign    Worried About Running Out of Food in the Last Year: Never true    Ran Out of Food in the Last Year: Never true  Transportation Needs: No Transportation Needs (01/27/2023)   PRAPARE -  Administrator, Civil Service (Medical): No    Lack of Transportation (Non-Medical): No  Physical Activity: Sufficiently Active (01/24/2023)   Received from [facility]   Exercise Vital Sign    Days of Exercise per Week: 5 days    Minutes of Exercise per Session: 50 min  Stress: Stress Concern Present (01/24/2023)   Received from [facility] of Occupational Health - Occupational Stress Questionnaire    Feeling of Stress : Rather much  Social Connections: Moderately Integrated (01/24/2023)   Received from [facility]   Social Connection and Isolation Panel [NHANES]    Frequency of Communication with Friends and Family: More than three times a week    Frequency of Social Gatherings with Friends and Family: More than three times a week    Attends Religious Services: More than 4 times per year    Active Member of [employer] or Organizations: No    Attends Banker Meetings: Never    Marital Status: Married  Catering manager Violence: Not At Risk (01/24/2023)   Received from [employer], Afraid, Rape, and Kick questionnaire    Fear of Current or Ex-Partner: No    Emotionally Abused: No    Physically Abused: No    Sexually Abused: No    Past Surgical History:  Procedure Laterality Date   Left heart catherization Left 01/24/2023   NASAL SEPTUM SURGERY     VASECTOMY      Family History  Problem Relation Age of Onset   Hyperlipidemia Mother    Hypertension Mother    Arthritis Father    Hyperlipidemia Father    Hypertension Father    Hypertension Brother    Hypotension Brother    Colon cancer Maternal Uncle    Arthritis Maternal Grandmother    Hyperlipidemia Maternal Grandmother    Heart disease Maternal Grandmother    Hypertension Maternal Grandmother    Stroke Maternal Grandfather    Hypertension Maternal Grandfather    Alcohol abuse Paternal Grandfather     Allergies  Allergen Reactions   Bee Venom Swelling     Current Outpatient Medications on File Prior to Visit  Medication Sig Dispense Refill   ascorbic acid (VITAMIN C) 250 MG CHEW Chew 2 tablets by mouth daily.     aspirin EC 81 MG tablet Take 81 mg by mouth daily. Swallow whole.     atorvastatin (LIPITOR) 80 MG tablet Take 1 tablet (80 mg total) by mouth daily. 90 tablet 3   Cholecalciferol 10 MCG (400 UNIT) CHEW Chew 2 tablets by mouth daily.     metoprolol succinate (TOPROL-XL) 25 MG 24 hr tablet Take 4 tablets (100 mg total) by mouth daily. Take with or immediately following a meal.  90 tablet 3   No current facility-administered medications on file prior to visit.    BP 118/82   Pulse 65   Temp 97.9 F (36.6 C) (Temporal)   Ht 5\' 7"  (1.702 m)   Wt 185 lb (83.9 kg)   SpO2 98%   BMI 28.98 kg/m  Objective:   Physical Exam Cardiovascular:     Rate and Rhythm: Normal rate and regular rhythm.  Pulmonary:     Effort: Pulmonary effort is normal.     Breath sounds: Normal breath sounds. No wheezing or rales.  Musculoskeletal:     Cervical back: Neck supple.  Skin:    General: Skin is warm and dry.  Neurological:     Mental Status: He is alert and oriented to person, place, and time.           Assessment & Plan:  Coronary artery dissection Assessment & Plan: Recent hospitalization in [place] earlier this month.  Reviewed hospital labs, notes, imaging.   Reviewed cardiology notes from September 2024.   Continue atorvastatin 80 mg daily, aspirin 81 mg daily, metoprolol succinate 25 mg daily.   Repeat lipid panel in 2 months.    Prediabetes Assessment & Plan: Controlled. Reviewed A1C from Care Everywhere in September 2024   Hyperlipidemia, unspecified hyperlipidemia type Assessment & Plan: Above goal in September 2024, reviewed labs through care everywhere.  Continue atorvastatin 80 mg daily. Repeat lipid panel and LFTs in 2 months.         Doreene Nest, NP

## 2023-02-02 NOTE — Patient Instructions (Addendum)
Schedule a lab only appointment for 2 months to repeat your cholesterol  Notify me when you send in the Holter monitor.  Please schedule a physical to meet with me in 6 months.   It was a pleasure to see you today!

## 2023-02-03 ENCOUNTER — Encounter: Payer: BC Managed Care – PPO | Attending: Cardiovascular Disease | Admitting: *Deleted

## 2023-02-03 DIAGNOSIS — I21A1 Myocardial infarction type 2: Secondary | ICD-10-CM

## 2023-02-03 DIAGNOSIS — I2542 Coronary artery dissection: Secondary | ICD-10-CM | POA: Insufficient documentation

## 2023-02-03 NOTE — Progress Notes (Signed)
Initial phone call completed. Diagnosis can be found in [facility] 9/1. EP Orientation scheduled for Monday, 9/16 at 2:30 pm.

## 2023-02-07 VITALS — Ht 68.0 in | Wt 187.6 lb

## 2023-02-07 DIAGNOSIS — I21A1 Myocardial infarction type 2: Secondary | ICD-10-CM | POA: Diagnosis not present

## 2023-02-07 DIAGNOSIS — I2542 Coronary artery dissection: Secondary | ICD-10-CM | POA: Diagnosis not present

## 2023-02-07 NOTE — Patient Instructions (Signed)
Patient Instructions  Patient Details  Name: Nicholas Arnold MRN: [id number] Date of Birth: December 23, 1965 Referring Provider:  Antonieta Iba, MD  Below are your personal goals for exercise, nutrition, and risk factors. Our goal is to help you stay on track towards obtaining and maintaining these goals. We will be discussing your progress on these goals with you throughout the program.  Initial Exercise Prescription:  Initial Exercise Prescription - 02/07/23 1600       Date of Initial Exercise RX and Referring Provider   Date 02/07/23    Referring Provider Julien Nordmann      Oxygen   Maintain Oxygen Saturation 88% or higher      Treadmill   MPH 3.8    Grade 0    Minutes 15    METs 3.91      Elliptical   Level 2    Speed 3    Minutes 15    METs 4.97      T5 Nustep   Level 4    SPM 80    Minutes 15    METs 4.97      Prescription Details   Frequency (times per week) 2    Duration Progress to 30 minutes of continuous aerobic without signs/symptoms of physical distress      Intensity   THRR 40-80% of Max Heartrate 106-146    Ratings of Perceived Exertion 11-13    Perceived Dyspnea 0-4      Progression   Progression Continue to progress workloads to maintain intensity without signs/symptoms of physical distress.      Resistance Training   Training Prescription Yes    Weight 10 lb    Reps 10-15             Exercise Goals: Frequency: Be able to perform aerobic exercise two to three times per week in program working toward 2-5 days per week of home exercise.  Intensity: Work with a perceived exertion of 11 (fairly light) - 15 (hard) while following your exercise prescription.  We will make changes to your prescription with you as you progress through the program.   Duration: Be able to do 30 to 45 minutes of continuous aerobic exercise in addition to a 5 minute warm-up and a 5 minute cool-down routine.   Nutrition Goals: Your personal nutrition goals will  be established when you do your nutrition analysis with the dietician.  The following are general nutrition guidelines to follow: Cholesterol < 200mg /day Sodium < 1500mg /day Fiber: Men over 50 yrs - 30 grams per day  Personal Goals:  Personal Goals and Risk Factors at Admission - 02/07/23 1645       Core Components/Risk Factors/Patient Goals on Admission    Weight Management Yes;Weight Loss    Intervention Weight Management: Develop a combined nutrition and exercise program designed to reach desired caloric intake, while maintaining appropriate intake of nutrient and fiber, sodium and fats, and appropriate energy expenditure required for the weight goal.;Weight Management: Provide education and appropriate resources to help participant work on and attain dietary goals.;Weight Management/Obesity: Establish reasonable short term and long term weight goals.    Admit Weight 187 lb 9.6 oz (85.1 kg)    Goal Weight: Short Term 182 lb 9.6 oz (82.8 kg)    Goal Weight: Long Term 177 lb 9.6 oz (80.6 kg)    Expected Outcomes Short Term: Continue to assess and modify interventions until short term weight is achieved;Long Term: Adherence to nutrition and physical activity/exercise program  aimed toward attainment of established weight goal;Weight Loss: Understanding of general recommendations for a balanced deficit meal plan, which promotes 1-2 lb weight loss per week and includes a negative energy balance of [id number] kcal/d;Understanding recommendations for meals to include 15-35% energy as protein, 25-35% energy from fat, 35-60% energy from carbohydrates, less than 200mg  of dietary cholesterol, 20-35 gm of total fiber daily;Understanding of distribution of calorie intake throughout the day with the consumption of 4-5 meals/snacks    Hypertension Yes    Intervention Monitor prescription use compliance.;Provide education on lifestyle modifcations including regular physical activity/exercise, weight management,  moderate sodium restriction and increased consumption of fresh fruit, vegetables, and low fat dairy, alcohol moderation, and smoking cessation.    Expected Outcomes Short Term: Continued assessment and intervention until BP is < 140/69mm HG in hypertensive participants. < 130/50mm HG in hypertensive participants with diabetes, heart failure or chronic kidney disease.;Long Term: Maintenance of blood pressure at goal levels.    Lipids Yes    Intervention Provide education and support for participant on nutrition & aerobic/resistive exercise along with prescribed medications to achieve LDL 70mg , HDL >40mg .    Expected Outcomes Short Term: Participant states understanding of desired cholesterol values and is compliant with medications prescribed. Participant is following exercise prescription and nutrition guidelines.;Long Term: Cholesterol controlled with medications as prescribed, with individualized exercise RX and with personalized nutrition plan. Value goals: LDL < 70mg , HDL > 40 mg.             Tobacco Use Initial Evaluation: Social History   Tobacco Use  Smoking Status Never  Smokeless Tobacco Never    Exercise Goals and Review:  Exercise Goals     Row Name 02/07/23 1640             Exercise Goals   Increase Physical Activity Yes       Intervention Provide advice, education, support and counseling about physical activity/exercise needs.;Develop an individualized exercise prescription for aerobic and resistive training based on initial evaluation findings, risk stratification, comorbidities and participant's personal goals.       Expected Outcomes Short Term: Attend rehab on a regular basis to increase amount of physical activity.;Long Term: Exercising regularly at least 3-5 days a week.;Long Term: Add in home exercise to make exercise part of routine and to increase amount of physical activity.       Increase Strength and Stamina Yes       Intervention Provide advice, education,  support and counseling about physical activity/exercise needs.;Develop an individualized exercise prescription for aerobic and resistive training based on initial evaluation findings, risk stratification, comorbidities and participant's personal goals.       Expected Outcomes Short Term: Increase workloads from initial exercise prescription for resistance, speed, and METs.;Short Term: Perform resistance training exercises routinely during rehab and add in resistance training at home;Long Term: Improve cardiorespiratory fitness, muscular endurance and strength as measured by increased METs and functional capacity ( )       Able to understand and use rate of perceived exertion (RPE) scale Yes       Intervention Provide education and explanation on how to use RPE scale       Expected Outcomes Short Term: Able to use RPE daily in rehab to express subjective intensity level;Long Term:  Able to use RPE to guide intensity level when exercising independently       Able to understand and use Dyspnea scale Yes       Intervention Provide education and explanation on  how to use Dyspnea scale       Expected Outcomes Short Term: Able to use Dyspnea scale daily in rehab to express subjective sense of shortness of breath during exertion;Long Term: Able to use Dyspnea scale to guide intensity level when exercising independently       Knowledge and understanding of Target Heart Rate Range (THRR) Yes       Intervention Provide education and explanation of THRR including how the numbers were predicted and where they are located for reference       Expected Outcomes Short Term: Able to state/look up THRR;Long Term: Able to use THRR to govern intensity when exercising independently;Short Term: Able to use daily as guideline for intensity in rehab       Able to check pulse independently Yes       Intervention Provide education and demonstration on how to check pulse in carotid and radial arteries.;Review the importance of  being able to check your own pulse for safety during independent exercise       Expected Outcomes Long Term: Able to check pulse independently and accurately;Short Term: Able to explain why pulse checking is important during independent exercise       Understanding of Exercise Prescription Yes       Intervention Provide education, explanation, and written materials on patient's individual exercise prescription       Expected Outcomes Short Term: Able to explain program exercise prescription;Long Term: Able to explain home exercise prescription to exercise independently

## 2023-02-07 NOTE — Progress Notes (Signed)
Cardiac Individual Treatment Plan  Patient Details  Name: Nicholas Arnold MRN: [id number] Date of Birth: 1966/03/09 Referring Provider:   Flowsheet Row Cardiac Rehab from 02/07/2023 in [facility] Cardiac and Pulmonary Rehab  Referring Provider Julien Nordmann       Initial Encounter Date:  Flowsheet Row Cardiac Rehab from 02/07/2023 in [facility] Cardiac and Pulmonary Rehab  Date 02/07/23       Visit Diagnosis: Type 2 MI (myocardial infarction) (HCC)  Patient's Home Medications on Admission:  Current Outpatient Medications:    ascorbic acid (VITAMIN C) 250 MG CHEW, Chew 2 tablets by mouth daily., Disp: , Rfl:    aspirin EC 81 MG tablet, Take 81 mg by mouth daily. Swallow whole., Disp: , Rfl:    atorvastatin (LIPITOR) 80 MG tablet, Take 1 tablet (80 mg total) by mouth daily., Disp: 90 tablet, Rfl: 3   Cholecalciferol 10 MCG (400 UNIT) CHEW, Chew 2 tablets by mouth daily., Disp: , Rfl:    metoprolol succinate (TOPROL-XL) 25 MG 24 hr tablet, Take 4 tablets (100 mg total) by mouth daily. Take with or immediately following a meal., Disp: 90 tablet, Rfl: 3  Past Medical History: Past Medical History:  Diagnosis Date   Chickenpox    GERD (gastroesophageal reflux disease)    Hyperlipemia    Neck mass    removed in 2012, benign   Prediabetes    Pyloric stenosis     Tobacco Use: Social History   Tobacco Use  Smoking Status Never  Smokeless Tobacco Never    Labs: Review Flowsheet  More data may exist      Latest Ref Rng & Units 03/12/2015 06/13/2015 08/13/2016 02/08/2017 06/24/2020  Labs for ITP Cardiac and Pulmonary Rehab  Cholestrol 0 - 200 mg/dL 782  956  213  086  578   LDL (calc) 0 - 99 mg/dL 469  629  528  413  244   HDL-C >39.00 mg/dL 01.02  72.53  66.44  03.47  46.40   Trlycerides 0.0 - 149.0 mg/dL 425.9  56.3  875.6  433.2  98.0   Hemoglobin A1c 4.6 - 6.5 % 5.6  - 5.8  5.6  5.7     Details             Exercise Target Goals: Exercise Program Goal: Individual exercise  prescription set using results from initial 6 min walk test and THRR while considering  patient's activity barriers and safety.   Exercise Prescription Goal: Initial exercise prescription builds to 30-45 minutes a day of aerobic activity, 2-3 days per week.  Home exercise guidelines will be given to patient during program as part of exercise prescription that the participant will acknowledge.   Education: Aerobic Exercise: - Group verbal and visual presentation on the components of exercise prescription. Introduces F.I.T.T principle from ACSM for exercise prescriptions.  Reviews F.I.T.T. principles of aerobic exercise including progression. Written material given at graduation.   Education: Resistance Exercise: - Group verbal and visual presentation on the components of exercise prescription. Introduces F.I.T.T principle from ACSM for exercise prescriptions  Reviews F.I.T.T. principles of resistance exercise including progression. Written material given at graduation.    Education: Exercise & Equipment Safety: - Individual verbal instruction and demonstration of equipment use and safety with use of the equipment. Flowsheet Row Cardiac Rehab from 02/07/2023 in [facility] Cardiac and Pulmonary Rehab  Date 02/07/23  Educator MB  Instruction Review Code 1- Verbalizes Understanding       Education: Exercise Physiology & General Exercise  Guidelines: - Group verbal and written instruction with models to review the exercise physiology of the cardiovascular system and associated critical values. Provides general exercise guidelines with specific guidelines to those with heart or lung disease.    Education: Flexibility, Balance, Mind/Body Relaxation: - Group verbal and visual presentation with interactive activity on the components of exercise prescription. Introduces F.I.T.T principle from ACSM for exercise prescriptions. Reviews F.I.T.T. principles of flexibility and balance exercise training including  progression. Also discusses the mind body connection.  Reviews various relaxation techniques to help reduce and manage stress (i.e. Deep breathing, progressive muscle relaxation, and visualization). Balance handout provided to take home. Written material given at graduation.   Activity Barriers & Risk Stratification:  Activity Barriers & Cardiac Risk Stratification - 02/07/23 1635       Activity Barriers & Cardiac Risk Stratification   Activity Barriers None    Cardiac Risk Stratification Moderate             6 Minute Walk:  6 Minute Walk     Row Name 02/07/23 1633         6 Minute Walk   Phase Initial     Distance 1890 feet     Walk Time 6 minutes     # of Rest Breaks 0     MPH 3.58     METS 4.97     RPE 7     Perceived Dyspnea  0     VO2 Peak 17.39     Symptoms No     Resting HR 67 bpm     Resting BP 92/62     Resting Oxygen Saturation  98 %     Exercise Oxygen Saturation  during 6 min walk 99 %     Max Ex. HR 127 bpm     Max Ex. BP 126/70     2 Minute Post BP 112/74              Oxygen Initial Assessment:   Oxygen Re-Evaluation:   Oxygen Discharge (Final Oxygen Re-Evaluation):   Initial Exercise Prescription:  Initial Exercise Prescription - 02/07/23 1600       Date of Initial Exercise RX and Referring Provider   Date 02/07/23    Referring Provider Julien Nordmann      Oxygen   Maintain Oxygen Saturation 88% or higher      Treadmill   MPH 3.8    Grade 0    Minutes 15    METs 3.91      Elliptical   Level 2    Speed 3    Minutes 15    METs 4.97      T5 Nustep   Level 4    SPM 80    Minutes 15    METs 4.97      Prescription Details   Frequency (times per week) 2    Duration Progress to 30 minutes of continuous aerobic without signs/symptoms of physical distress      Intensity   THRR 40-80% of Max Heartrate 106-146    Ratings of Perceived Exertion 11-13    Perceived Dyspnea 0-4      Progression   Progression Continue to  progress workloads to maintain intensity without signs/symptoms of physical distress.      Resistance Training   Training Prescription Yes    Weight 10 lb    Reps 10-15             Perform Capillary Blood Glucose checks  as needed.  Exercise Prescription Changes:   Exercise Prescription Changes     Row Name 02/07/23 1600             Response to Exercise   Blood Pressure (Admit) 92/62       Blood Pressure (Exercise) 126/70       Blood Pressure (Exit) 112/74       Heart Rate (Admit) 67 bpm       Heart Rate (Exercise) 127 bpm       Heart Rate (Exit) 68 bpm       Oxygen Saturation (Admit) 98 %       Oxygen Saturation (Exercise) 99 %       Oxygen Saturation (Exit) 99 %       Rating of Perceived Exertion (Exercise) 7       Perceived Dyspnea (Exercise) 0       Symptoms none       Comments results       Duration Progress to 30 minutes of  aerobic without signs/symptoms of physical distress       Intensity THRR New         Progression   Progression Continue to progress workloads to maintain intensity without signs/symptoms of physical distress.       Average METs 4.97                Exercise Comments:   Exercise Goals and Review:   Exercise Goals     Row Name 02/07/23 1640             Exercise Goals   Increase Physical Activity Yes       Intervention Provide advice, education, support and counseling about physical activity/exercise needs.;Develop an individualized exercise prescription for aerobic and resistive training based on initial evaluation findings, risk stratification, comorbidities and participant's personal goals.       Expected Outcomes Short Term: Attend rehab on a regular basis to increase amount of physical activity.;Long Term: Exercising regularly at least 3-5 days a week.;Long Term: Add in home exercise to make exercise part of routine and to increase amount of physical activity.       Increase Strength and Stamina Yes        Intervention Provide advice, education, support and counseling about physical activity/exercise needs.;Develop an individualized exercise prescription for aerobic and resistive training based on initial evaluation findings, risk stratification, comorbidities and participant's personal goals.       Expected Outcomes Short Term: Increase workloads from initial exercise prescription for resistance, speed, and METs.;Short Term: Perform resistance training exercises routinely during rehab and add in resistance training at home;Long Term: Improve cardiorespiratory fitness, muscular endurance and strength as measured by increased METs and functional capacity ( )       Able to understand and use rate of perceived exertion (RPE) scale Yes       Intervention Provide education and explanation on how to use RPE scale       Expected Outcomes Short Term: Able to use RPE daily in rehab to express subjective intensity level;Long Term:  Able to use RPE to guide intensity level when exercising independently       Able to understand and use Dyspnea scale Yes       Intervention Provide education and explanation on how to use Dyspnea scale       Expected Outcomes Short Term: Able to use Dyspnea scale daily in rehab to express subjective sense of shortness of breath during exertion;Long  Term: Able to use Dyspnea scale to guide intensity level when exercising independently       Knowledge and understanding of Target Heart Rate Range (THRR) Yes       Intervention Provide education and explanation of THRR including how the numbers were predicted and where they are located for reference       Expected Outcomes Short Term: Able to state/look up THRR;Long Term: Able to use THRR to govern intensity when exercising independently;Short Term: Able to use daily as guideline for intensity in rehab       Able to check pulse independently Yes       Intervention Provide education and demonstration on how to check pulse in carotid and  radial arteries.;Review the importance of being able to check your own pulse for safety during independent exercise       Expected Outcomes Long Term: Able to check pulse independently and accurately;Short Term: Able to explain why pulse checking is important during independent exercise       Understanding of Exercise Prescription Yes       Intervention Provide education, explanation, and written materials on patient's individual exercise prescription       Expected Outcomes Short Term: Able to explain program exercise prescription;Long Term: Able to explain home exercise prescription to exercise independently                Exercise Goals Re-Evaluation :   Discharge Exercise Prescription (Final Exercise Prescription Changes):  Exercise Prescription Changes - 02/07/23 1600       Response to Exercise   Blood Pressure (Admit) 92/62    Blood Pressure (Exercise) 126/70    Blood Pressure (Exit) 112/74    Heart Rate (Admit) 67 bpm    Heart Rate (Exercise) 127 bpm    Heart Rate (Exit) 68 bpm    Oxygen Saturation (Admit) 98 %    Oxygen Saturation (Exercise) 99 %    Oxygen Saturation (Exit) 99 %    Rating of Perceived Exertion (Exercise) 7    Perceived Dyspnea (Exercise) 0    Symptoms none    Comments results    Duration Progress to 30 minutes of  aerobic without signs/symptoms of physical distress    Intensity THRR New      Progression   Progression Continue to progress workloads to maintain intensity without signs/symptoms of physical distress.    Average METs 4.97             Nutrition:  Target Goals: Understanding of nutrition guidelines, daily intake of sodium 1500mg , cholesterol 200mg , calories 30% from fat and 7% or less from saturated fats, daily to have 5 or more servings of fruits and vegetables.  Education: All About Nutrition: -Group instruction provided by verbal, written material, interactive activities, discussions, models, and posters to present general  guidelines for heart healthy nutrition including fat, fiber, MyPlate, the role of sodium in heart healthy nutrition, utilization of the nutrition label, and utilization of this knowledge for meal planning. Follow up email sent as well. Written material given at graduation.   Biometrics:  Pre Biometrics - 02/07/23 1640       Pre Biometrics   Height 5\' 8"  (1.727 m)    Weight 187 lb 9.6 oz (85.1 kg)    Waist Circumference 39.5 inches    Hip Circumference 38.2 inches    Waist to Hip Ratio 1.03 %    BMI (Calculated) 28.53    Single Leg Stand 30 seconds  Nutrition Therapy Plan and Nutrition Goals:  Nutrition Therapy & Goals - 02/07/23 1643       Personal Nutrition Goals   Nutrition Goal Will meet with RD once work schedule is set after going back into the office      Intervention Plan   Intervention Prescribe, educate and counsel regarding individualized specific dietary modifications aiming towards targeted core components such as weight, hypertension, lipid management, diabetes, heart failure and other comorbidities.;Nutrition handout(s) given to patient.    Expected Outcomes Short Term Goal: Understand basic principles of dietary content, such as calories, fat, sodium, cholesterol and nutrients.;Long Term Goal: Adherence to prescribed nutrition plan.;Short Term Goal: A plan has been developed with personal nutrition goals set during dietitian appointment.             Nutrition Assessments:  MEDIFICTS Score Key: >=70 Need to make dietary changes  40-70 Heart Healthy Diet <= 40 Therapeutic Level Cholesterol Diet  Flowsheet Row Cardiac Rehab from 02/07/2023 in [facility] Cardiac and Pulmonary Rehab  Picture Your Plate Total Score on Admission 59      Picture Your Plate Scores: <32 Unhealthy dietary pattern with much room for improvement. 41-50 Dietary pattern unlikely to meet recommendations for good health and room for improvement. 51-60 More healthful dietary  pattern, with some room for improvement.  >60 Healthy dietary pattern, although there may be some specific behaviors that could be improved.    Nutrition Goals Re-Evaluation:   Nutrition Goals Discharge (Final Nutrition Goals Re-Evaluation):   Psychosocial: Target Goals: Acknowledge presence or absence of significant depression and/or stress, maximize coping skills, provide positive support system. Participant is able to verbalize types and ability to use techniques and skills needed for reducing stress and depression.   Education: Stress, Anxiety, and Depression - Group verbal and visual presentation to define topics covered.  Reviews how body is impacted by stress, anxiety, and depression.  Also discusses healthy ways to reduce stress and to treat/manage anxiety and depression.  Written material given at graduation.   Education: Sleep Hygiene -Provides group verbal and written instruction about how sleep can affect your health.  Define sleep hygiene, discuss sleep cycles and impact of sleep habits. Review good sleep hygiene tips.    Initial Review & Psychosocial Screening:  Initial Psych Review & Screening - 02/03/23 1341       Initial Review   Current issues with Current Stress Concerns    Comments healing from MI      Family Dynamics   Good Support System? Yes   family     Barriers   Psychosocial barriers to participate in program There are no identifiable barriers or psychosocial needs.;The patient should benefit from training in stress management and relaxation.      Screening Interventions   Interventions Encouraged to exercise;Provide feedback about the scores to participant;To provide support and resources with identified psychosocial needs    Expected Outcomes Short Term goal: Utilizing psychosocial counselor, staff and physician to assist with identification of specific Stressors or current issues interfering with healing process. Setting desired goal for each stressor  or current issue identified.;Long Term Goal: Stressors or current issues are controlled or eliminated.;Short Term goal: Identification and review with participant of any Quality of Life or Depression concerns found by scoring the questionnaire.;Long Term goal: The participant improves quality of Life and PHQ9 Scores as seen by post scores and/or verbalization of changes             Quality of Life Scores:  Quality of Life - 02/07/23 1642       Quality of Life   Select Quality of Life      Quality of Life Scores   Health/Function Pre 27.83 %    Socioeconomic Pre 28.13 %    Psych/Spiritual Pre 27.21 %    Family Pre 27 %    GLOBAL Pre 27.66 %            Scores of 19 and below usually indicate a poorer quality of life in these areas.  A difference of  2-3 points is a clinically meaningful difference.  A difference of 2-3 points in the total score of the Quality of Life Index has been associated with significant improvement in overall quality of life, self-image, physical symptoms, and general health in studies assessing change in quality of life.  PHQ-9: Review Flowsheet       02/07/2023 02/02/2023 06/24/2020  Depression screen PHQ 2/9  Decreased Interest 0 0 0  Down, Depressed, Hopeless 0 0 0  PHQ - 2 Score 0 0 0  Altered sleeping 0 - -  Tired, decreased energy 0 - -  Change in appetite 0 - -  Feeling bad or failure about yourself  0 - -  Trouble concentrating 0 - -  Moving slowly or fidgety/restless 0 - -  Suicidal thoughts 0 - -  PHQ-9 Score 0 - -    Details           Interpretation of Total Score  Total Score Depression Severity:  1-4 = Minimal depression, 5-9 = Mild depression, 10-14 = Moderate depression, 15-19 = Moderately severe depression, 20-27 = Severe depression   Psychosocial Evaluation and Intervention:  Psychosocial Evaluation - 02/03/23 1349       Psychosocial Evaluation & Interventions   Comments Mr. Sigley is coming to cardiac rehab after a type  2 MI/ SCAD. This event came as a surprise to him and has been stressful learning how to recover from. He lives a very active lifestyle, including exercising regularly, working on cars, water sports, and yard work. He plans on returning to work next week by easing into working from home some and then the office more. He has a very supportive family. He wants to work on his stamina and education related to living  heart healthy lifestyle. He is ready to get back to his usual exercise routine and is grateful for the heart monitoring in the program.    Expected Outcomes Short: attend cardiac rehab for education and exercise. Long; Develop and maitnain positive self care habits.    Continue Psychosocial Services  Follow up required by staff             Psychosocial Re-Evaluation:   Psychosocial Discharge (Final Psychosocial Re-Evaluation):   Vocational Rehabilitation: Provide vocational rehab assistance to qualifying candidates.   Vocational Rehab Evaluation & Intervention:  Vocational Rehab - 02/07/23 1644       Initial Vocational Rehab Evaluation & Intervention   Assessment shows need for Vocational Rehabilitation No             Education: Education Goals: Education classes will be provided on a variety of topics geared toward better understanding of heart health and risk factor modification. Participant will state understanding/return demonstration of topics presented as noted by education test scores.  Learning Barriers/Preferences:  Learning Barriers/Preferences - 02/03/23 1340       Learning Barriers/Preferences   Learning Barriers None    Learning Preferences None  General Cardiac Education Topics:  AED/CPR: - Group verbal and written instruction with the use of models to demonstrate the basic use of the AED with the basic ABC's of resuscitation.   Anatomy and Cardiac Procedures: - Group verbal and visual presentation and models provide information  about basic cardiac anatomy and function. Reviews the testing methods done to diagnose heart disease and the outcomes of the test results. Describes the treatment choices: Medical Management, Angioplasty, or Coronary Bypass Surgery for treating various heart conditions including Myocardial Infarction, Angina, Valve Disease, and Cardiac Arrhythmias.  Written material given at graduation. Flowsheet Row Cardiac Rehab from 02/07/2023 in [facility] Cardiac and Pulmonary Rehab  Education need identified 02/07/23       Medication Safety: - Group verbal and visual instruction to review commonly prescribed medications for heart and lung disease. Reviews the medication, class of the drug, and side effects. Includes the steps to properly store meds and maintain the prescription regimen.  Written material given at graduation.   Intimacy: - Group verbal instruction through game format to discuss how heart and lung disease can affect sexual intimacy. Written material given at graduation..   Know Your Numbers and Heart Failure: - Group verbal and visual instruction to discuss disease risk factors for cardiac and pulmonary disease and treatment options.  Reviews associated critical values for Overweight/Obesity, Hypertension, Cholesterol, and Diabetes.  Discusses basics of heart failure: signs/symptoms and treatments.  Introduces Heart Failure Zone chart for action plan for heart failure.  Written material given at graduation.   Infection Prevention: - Provides verbal and written material to individual with discussion of infection control including proper hand washing and proper equipment cleaning during exercise session. Flowsheet Row Cardiac Rehab from 02/07/2023 in [facility] Cardiac and Pulmonary Rehab  Date 02/07/23  Educator MB  Instruction Review Code 1- Verbalizes Understanding       Falls Prevention: - Provides verbal and written material to individual with discussion of falls prevention and  safety. Flowsheet Row Cardiac Rehab from 02/07/2023 in [facility] Cardiac and Pulmonary Rehab  Date 02/07/23  Educator MB  Instruction Review Code 1- Verbalizes Understanding       Other: -Provides group and verbal instruction on various topics (see comments)   Knowledge Questionnaire Score:  Knowledge Questionnaire Score - 02/07/23 1644       Knowledge Questionnaire Score   Pre Score 25/26             Core Components/Risk Factors/Patient Goals at Admission:  Personal Goals and Risk Factors at Admission - 02/07/23 1645       Core Components/Risk Factors/Patient Goals on Admission    Weight Management Yes;Weight Loss    Intervention Weight Management: Develop a combined nutrition and exercise program designed to reach desired caloric intake, while maintaining appropriate intake of nutrient and fiber, sodium and fats, and appropriate energy expenditure required for the weight goal.;Weight Management: Provide education and appropriate resources to help participant work on and attain dietary goals.;Weight Management/Obesity: Establish reasonable short term and long term weight goals.    Admit Weight 187 lb 9.6 oz (85.1 kg)    Goal Weight: Short Term 182 lb 9.6 oz (82.8 kg)    Goal Weight: Long Term 177 lb 9.6 oz (80.6 kg)    Expected Outcomes Short Term: Continue to assess and modify interventions until short term weight is achieved;Long Term: Adherence to nutrition and physical activity/exercise program aimed toward attainment of established weight goal;Weight Loss: Understanding of general recommendations for a balanced deficit meal plan,  which promotes 1-2 lb weight loss per week and includes a negative energy balance of [phone] kcal/d;Understanding recommendations for meals to include 15-35% energy as protein, 25-35% energy from fat, 35-60% energy from carbohydrates, less than 200mg  of dietary cholesterol, 20-35 gm of total fiber daily;Understanding of distribution of calorie intake  throughout the day with the consumption of 4-5 meals/snacks    Hypertension Yes    Intervention Monitor prescription use compliance.;Provide education on lifestyle modifcations including regular physical activity/exercise, weight management, moderate sodium restriction and increased consumption of fresh fruit, vegetables, and low fat dairy, alcohol moderation, and smoking cessation.    Expected Outcomes Short Term: Continued assessment and intervention until BP is < 140/42mm HG in hypertensive participants. < 130/24mm HG in hypertensive participants with diabetes, heart failure or chronic kidney disease.;Long Term: Maintenance of blood pressure at goal levels.    Lipids Yes    Intervention Provide education and support for participant on nutrition & aerobic/resistive exercise along with prescribed medications to achieve LDL 70mg , HDL >40mg .    Expected Outcomes Short Term: Participant states understanding of desired cholesterol values and is compliant with medications prescribed. Participant is following exercise prescription and nutrition guidelines.;Long Term: Cholesterol controlled with medications as prescribed, with individualized exercise RX and with personalized nutrition plan. Value goals: LDL < 70mg , HDL > 40 mg.             Education:Diabetes - Individual verbal and written instruction to review signs/symptoms of diabetes, desired ranges of glucose level fasting, after meals and with exercise. Acknowledge that pre and post exercise glucose checks will be done for 3 sessions at entry of program.   Core Components/Risk Factors/Patient Goals Review:    Core Components/Risk Factors/Patient Goals at Discharge (Final Review):    ITP Comments:  ITP Comments     Row Name 02/03/23 1339 02/07/23 1633         ITP Comments Initial phone call completed. Diagnosis can be found in [facility] 9/1. EP Orientation scheduled for Monday, 9/16 at 2:30 pm. Completed and gym orientation. Initial ITP  created and sent for review to Dr. Bethann Punches, Medical Director.               Comments: Initial ITP

## 2023-02-10 ENCOUNTER — Ambulatory Visit: Payer: BC Managed Care – PPO | Admitting: Internal Medicine

## 2023-02-15 ENCOUNTER — Encounter: Payer: BC Managed Care – PPO | Admitting: *Deleted

## 2023-02-15 DIAGNOSIS — I2542 Coronary artery dissection: Secondary | ICD-10-CM | POA: Diagnosis not present

## 2023-02-15 DIAGNOSIS — I21A1 Myocardial infarction type 2: Secondary | ICD-10-CM

## 2023-02-15 NOTE — Progress Notes (Signed)
Daily Session Note  Patient Details  Name: Nicholas Arnold MRN: [id number] Date of Birth: 09-Jul-1965 Referring Provider:   Flowsheet Row Cardiac Rehab from 02/07/2023 in [facility] Cardiac and Pulmonary Rehab  Referring Provider Julien Nordmann       Encounter Date: 02/15/2023  Check In:  Session Check In - 02/15/23 1111       Check-In   Supervising physician immediately available to respond to emergencies See telemetry face sheet for immediately available ER MD    Location [facility]-Cardiac & Pulmonary Rehab    Staff Present Ronette Deter, BS, Exercise Physiologist;Meredith Jewel Baize, RN BSN;Maxon Conetta BS, , Exercise Physiologist;Jhamal Plucinski Katrinka Blazing, RN, ADN    Virtual Visit No    Medication changes reported     No    Fall or balance concerns reported    No    Warm-up and Cool-down Performed on first and last piece of equipment    Resistance Training Performed Yes    VAD Patient? No    PAD/SET Patient? No      Pain Assessment   Currently in Pain? No/denies                Social History   Tobacco Use  Smoking Status Never  Smokeless Tobacco Never    Goals Met:  Independence with exercise equipment Exercise tolerated well No report of concerns or symptoms today Strength training completed today  Goals Unmet:  Not Applicable  Comments: First full day of exercise!  Patient was oriented to gym and equipment including functions, settings, policies, and procedures.  Patient's individual exercise prescription and treatment plan were reviewed.  All starting workloads were established based on the results of the 6 minute walk test done at initial orientation visit.  The plan for exercise progression was also introduced and progression will be customized based on patient's performance and goals.    Dr. Bethann Punches is Medical Director for [facility] Cardiac Rehabilitation.  Dr. Vida Rigger is Medical Director for [facility] Pulmonary Rehabilitation.

## 2023-02-17 DIAGNOSIS — I21A1 Myocardial infarction type 2: Secondary | ICD-10-CM | POA: Diagnosis not present

## 2023-02-17 DIAGNOSIS — I2542 Coronary artery dissection: Secondary | ICD-10-CM | POA: Diagnosis not present

## 2023-02-17 NOTE — Progress Notes (Signed)
Daily Session Note  Patient Details  Name: Nicholas Arnold MRN: [id number] Date of Birth: 1965-11-14 Referring Provider:   Flowsheet Row Cardiac Rehab from 02/07/2023 in [facility] Cardiac and Pulmonary Rehab  Referring Provider Julien Nordmann       Encounter Date: 02/17/2023  Check In:  Session Check In - 02/17/23 1131       Check-In   Supervising physician immediately available to respond to emergencies See telemetry face sheet for immediately available ER MD    Location [facility]-Cardiac & Pulmonary Rehab    Staff Present Susann Givens, RN BSN;Laureen Manson Passey, BS, RRT, CPFT;Maxon Conetta BS, , Exercise Physiologist;Eryca Bolte Katrinka Blazing, RN, ADN    Virtual Visit No    Medication changes reported     No    Fall or balance concerns reported    No    Warm-up and Cool-down Performed on first and last piece of equipment    Resistance Training Performed Yes    VAD Patient? No    PAD/SET Patient? No      Pain Assessment   Currently in Pain? No/denies                Social History   Tobacco Use  Smoking Status Never  Smokeless Tobacco Never    Goals Met:  Independence with exercise equipment Exercise tolerated well No report of concerns or symptoms today Strength training completed today  Goals Unmet:  Not Applicable  Comments: Pt able to follow exercise prescription today without complaint.  Will continue to monitor for progression.    Dr. Bethann Punches is Medical Director for [facility] Cardiac Rehabilitation.  Dr. Vida Rigger is Medical Director for [facility] Pulmonary Rehabilitation.

## 2023-02-22 ENCOUNTER — Encounter: Payer: BC Managed Care – PPO | Attending: Cardiovascular Disease | Admitting: *Deleted

## 2023-02-22 DIAGNOSIS — Z48812 Encounter for surgical aftercare following surgery on the circulatory system: Secondary | ICD-10-CM | POA: Insufficient documentation

## 2023-02-22 DIAGNOSIS — I21A1 Myocardial infarction type 2: Secondary | ICD-10-CM | POA: Insufficient documentation

## 2023-02-22 NOTE — Progress Notes (Signed)
Daily Session Note  Patient Details  Name: KEVAUGHN EWING MRN: [id number] Date of Birth: 02-10-1966 Referring Provider:   Flowsheet Row Cardiac Rehab from 02/07/2023 in [facility] Cardiac and Pulmonary Rehab  Referring Provider Julien Nordmann       Encounter Date: 02/22/2023  Check In:  Session Check In - 02/22/23 1139       Check-In   Supervising physician immediately available to respond to emergencies See telemetry face sheet for immediately available ER MD    Location [facility]-Cardiac & Pulmonary Rehab    Staff Present Rory Percy, MS, Exercise Physiologist;Meredith Jewel Baize, RN BSN;Osker Ayoub, RN, BSN, CCRP;Noah Tickle, BS, Exercise Physiologist;Maxon [employer], , Exercise Physiologist    Virtual Visit No    Medication changes reported     No    Fall or balance concerns reported    No    Warm-up and Cool-down Performed on first and last piece of equipment    Resistance Training Performed Yes    VAD Patient? No    PAD/SET Patient? No      Pain Assessment   Currently in Pain? No/denies                Social History   Tobacco Use  Smoking Status Never  Smokeless Tobacco Never    Goals Met:  Independence with exercise equipment Exercise tolerated well No report of concerns or symptoms today  Goals Unmet:  Not Applicable  Comments: Pt able to follow exercise prescription today without complaint.  Will continue to monitor for progression.    Dr. Bethann Punches is Medical Director for [facility] Cardiac Rehabilitation.  Dr. Vida Rigger is Medical Director for [facility] Pulmonary Rehabilitation.

## 2023-02-23 ENCOUNTER — Encounter: Payer: Self-pay | Admitting: *Deleted

## 2023-02-23 DIAGNOSIS — I21A1 Myocardial infarction type 2: Secondary | ICD-10-CM

## 2023-02-23 NOTE — Progress Notes (Signed)
Cardiac Individual Treatment Plan  Patient Details  Name: Nicholas Arnold MRN: [id number] Date of Birth: 1965/11/15 Referring Provider:   Flowsheet Row Cardiac Rehab from 02/07/2023 in [facility] Cardiac and Pulmonary Rehab  Referring Provider Julien Nordmann       Initial Encounter Date:  Flowsheet Row Cardiac Rehab from 02/07/2023 in [facility] Cardiac and Pulmonary Rehab  Date 02/07/23       Visit Diagnosis: Type 2 MI (myocardial infarction) (HCC)  Patient's Home Medications on Admission:  Current Outpatient Medications:    ascorbic acid (VITAMIN C) 250 MG CHEW, Chew 2 tablets by mouth daily., Disp: , Rfl:    aspirin EC 81 MG tablet, Take 81 mg by mouth daily. Swallow whole., Disp: , Rfl:    atorvastatin (LIPITOR) 80 MG tablet, Take 1 tablet (80 mg total) by mouth daily., Disp: 90 tablet, Rfl: 3   Cholecalciferol 10 MCG (400 UNIT) CHEW, Chew 2 tablets by mouth daily., Disp: , Rfl:    metoprolol succinate (TOPROL-XL) 25 MG 24 hr tablet, Take 4 tablets (100 mg total) by mouth daily. Take with or immediately following a meal., Disp: 90 tablet, Rfl: 3  Past Medical History: Past Medical History:  Diagnosis Date   Chickenpox    GERD (gastroesophageal reflux disease)    Hyperlipemia    Neck mass    removed in 2012, benign   Prediabetes    Pyloric stenosis     Tobacco Use: Social History   Tobacco Use  Smoking Status Never  Smokeless Tobacco Never    Labs: Review Flowsheet  More data may exist      Latest Ref Rng & Units 03/12/2015 06/13/2015 08/13/2016 02/08/2017 06/24/2020  Labs for ITP Cardiac and Pulmonary Rehab  Cholestrol 0 - 200 mg/dL 782  956  213  086  578   LDL (calc) 0 - 99 mg/dL 469  629  528  413  244   HDL-C >39.00 mg/dL 01.02  72.53  66.44  03.47  46.40   Trlycerides 0.0 - 149.0 mg/dL 425.9  56.3  875.6  433.2  98.0   Hemoglobin A1c 4.6 - 6.5 % 5.6  - 5.8  5.6  5.7     Details             Exercise Target Goals: Exercise Program Goal: Individual exercise  prescription set using results from initial 6 min walk test and THRR while considering  patient's activity barriers and safety.   Exercise Prescription Goal: Initial exercise prescription builds to 30-45 minutes a day of aerobic activity, 2-3 days per week.  Home exercise guidelines will be given to patient during program as part of exercise prescription that the participant will acknowledge.   Education: Aerobic Exercise: - Group verbal and visual presentation on the components of exercise prescription. Introduces F.I.T.T principle from ACSM for exercise prescriptions.  Reviews F.I.T.T. principles of aerobic exercise including progression. Written material given at graduation.   Education: Resistance Exercise: - Group verbal and visual presentation on the components of exercise prescription. Introduces F.I.T.T principle from ACSM for exercise prescriptions  Reviews F.I.T.T. principles of resistance exercise including progression. Written material given at graduation.    Education: Exercise & Equipment Safety: - Individual verbal instruction and demonstration of equipment use and safety with use of the equipment. Flowsheet Row Cardiac Rehab from 02/07/2023 in [facility] Cardiac and Pulmonary Rehab  Date 02/07/23  Educator MB  Instruction Review Code 1- Verbalizes Understanding       Education: Exercise Physiology & General Exercise  Guidelines: - Group verbal and written instruction with models to review the exercise physiology of the cardiovascular system and associated critical values. Provides general exercise guidelines with specific guidelines to those with heart or lung disease.    Education: Flexibility, Balance, Mind/Body Relaxation: - Group verbal and visual presentation with interactive activity on the components of exercise prescription. Introduces F.I.T.T principle from ACSM for exercise prescriptions. Reviews F.I.T.T. principles of flexibility and balance exercise training including  progression. Also discusses the mind body connection.  Reviews various relaxation techniques to help reduce and manage stress (i.e. Deep breathing, progressive muscle relaxation, and visualization). Balance handout provided to take home. Written material given at graduation.   Activity Barriers & Risk Stratification:  Activity Barriers & Cardiac Risk Stratification - 02/07/23 1635       Activity Barriers & Cardiac Risk Stratification   Activity Barriers None    Cardiac Risk Stratification Moderate             6 Minute Walk:  6 Minute Walk     Row Name 02/07/23 1633         6 Minute Walk   Phase Initial     Distance 1890 feet     Walk Time 6 minutes     # of Rest Breaks 0     MPH 3.58     METS 4.97     RPE 7     Perceived Dyspnea  0     VO2 Peak 17.39     Symptoms No     Resting HR 67 bpm     Resting BP 92/62     Resting Oxygen Saturation  98 %     Exercise Oxygen Saturation  during 6 min walk 99 %     Max Ex. HR 127 bpm     Max Ex. BP 126/70     2 Minute Post BP 112/74              Oxygen Initial Assessment:   Oxygen Re-Evaluation:   Oxygen Discharge (Final Oxygen Re-Evaluation):   Initial Exercise Prescription:  Initial Exercise Prescription - 02/07/23 1600       Date of Initial Exercise RX and Referring Provider   Date 02/07/23    Referring Provider Julien Nordmann      Oxygen   Maintain Oxygen Saturation 88% or higher      Treadmill   MPH 3.8    Grade 0    Minutes 15    METs 3.91      Elliptical   Level 2    Speed 3    Minutes 15    METs 4.97      T5 Nustep   Level 4    SPM 80    Minutes 15    METs 4.97      Prescription Details   Frequency (times per week) 2    Duration Progress to 30 minutes of continuous aerobic without signs/symptoms of physical distress      Intensity   THRR 40-80% of Max Heartrate 106-146    Ratings of Perceived Exertion 11-13    Perceived Dyspnea 0-4      Progression   Progression Continue to  progress workloads to maintain intensity without signs/symptoms of physical distress.      Resistance Training   Training Prescription Yes    Weight 10 lb    Reps 10-15             Perform Capillary Blood Glucose checks  as needed.  Exercise Prescription Changes:   Exercise Prescription Changes     Row Name 02/07/23 1600             Response to Exercise   Blood Pressure (Admit) 92/62       Blood Pressure (Exercise) 126/70       Blood Pressure (Exit) 112/74       Heart Rate (Admit) 67 bpm       Heart Rate (Exercise) 127 bpm       Heart Rate (Exit) 68 bpm       Oxygen Saturation (Admit) 98 %       Oxygen Saturation (Exercise) 99 %       Oxygen Saturation (Exit) 99 %       Rating of Perceived Exertion (Exercise) 7       Perceived Dyspnea (Exercise) 0       Symptoms none       Comments results       Duration Progress to 30 minutes of  aerobic without signs/symptoms of physical distress       Intensity THRR New         Progression   Progression Continue to progress workloads to maintain intensity without signs/symptoms of physical distress.       Average METs 4.97                Exercise Comments:   Exercise Comments     Row Name 02/15/23 1112           Exercise Comments First full day of exercise!  Patient was oriented to gym and equipment including functions, settings, policies, and procedures.  Patient's individual exercise prescription and treatment plan were reviewed.  All starting workloads were established based on the results of the 6 minute walk test done at initial orientation visit.  The plan for exercise progression was also introduced and progression will be customized based on patient's performance and goals.                Exercise Goals and Review:   Exercise Goals     Row Name 02/07/23 1640             Exercise Goals   Increase Physical Activity Yes       Intervention Provide advice, education, support and counseling  about physical activity/exercise needs.;Develop an individualized exercise prescription for aerobic and resistive training based on initial evaluation findings, risk stratification, comorbidities and participant's personal goals.       Expected Outcomes Short Term: Attend rehab on a regular basis to increase amount of physical activity.;Long Term: Exercising regularly at least 3-5 days a week.;Long Term: Add in home exercise to make exercise part of routine and to increase amount of physical activity.       Increase Strength and Stamina Yes       Intervention Provide advice, education, support and counseling about physical activity/exercise needs.;Develop an individualized exercise prescription for aerobic and resistive training based on initial evaluation findings, risk stratification, comorbidities and participant's personal goals.       Expected Outcomes Short Term: Increase workloads from initial exercise prescription for resistance, speed, and METs.;Short Term: Perform resistance training exercises routinely during rehab and add in resistance training at home;Long Term: Improve cardiorespiratory fitness, muscular endurance and strength as measured by increased METs and functional capacity ( )       Able to understand and use rate of perceived exertion (RPE) scale Yes  Intervention Provide education and explanation on how to use RPE scale       Expected Outcomes Short Term: Able to use RPE daily in rehab to express subjective intensity level;Long Term:  Able to use RPE to guide intensity level when exercising independently       Able to understand and use Dyspnea scale Yes       Intervention Provide education and explanation on how to use Dyspnea scale       Expected Outcomes Short Term: Able to use Dyspnea scale daily in rehab to express subjective sense of shortness of breath during exertion;Long Term: Able to use Dyspnea scale to guide intensity level when exercising independently        Knowledge and understanding of Target Heart Rate Range (THRR) Yes       Intervention Provide education and explanation of THRR including how the numbers were predicted and where they are located for reference       Expected Outcomes Short Term: Able to state/look up THRR;Long Term: Able to use THRR to govern intensity when exercising independently;Short Term: Able to use daily as guideline for intensity in rehab       Able to check pulse independently Yes       Intervention Provide education and demonstration on how to check pulse in carotid and radial arteries.;Review the importance of being able to check your own pulse for safety during independent exercise       Expected Outcomes Long Term: Able to check pulse independently and accurately;Short Term: Able to explain why pulse checking is important during independent exercise       Understanding of Exercise Prescription Yes       Intervention Provide education, explanation, and written materials on patient's individual exercise prescription       Expected Outcomes Short Term: Able to explain program exercise prescription;Long Term: Able to explain home exercise prescription to exercise independently                Exercise Goals Re-Evaluation :  Exercise Goals Re-Evaluation     Row Name 02/15/23 1112             Exercise Goal Re-Evaluation   Exercise Goals Review Able to understand and use rate of perceived exertion (RPE) scale;Able to understand and use Dyspnea scale;Knowledge and understanding of Target Heart Rate Range (THRR);Understanding of Exercise Prescription       Comments Reviewed RPE and dyspnea scale, THR and program prescription with pt today.  Pt voiced understanding and was given a copy of goals to take home.       Expected Outcomes Short: Use RPE daily to regulate intensity. Long: Follow program prescription in THR.                Discharge Exercise Prescription (Final Exercise Prescription Changes):  Exercise  Prescription Changes - 02/07/23 1600       Response to Exercise   Blood Pressure (Admit) 92/62    Blood Pressure (Exercise) 126/70    Blood Pressure (Exit) 112/74    Heart Rate (Admit) 67 bpm    Heart Rate (Exercise) 127 bpm    Heart Rate (Exit) 68 bpm    Oxygen Saturation (Admit) 98 %    Oxygen Saturation (Exercise) 99 %    Oxygen Saturation (Exit) 99 %    Rating of Perceived Exertion (Exercise) 7    Perceived Dyspnea (Exercise) 0    Symptoms none    Comments results  Duration Progress to 30 minutes of  aerobic without signs/symptoms of physical distress    Intensity THRR New      Progression   Progression Continue to progress workloads to maintain intensity without signs/symptoms of physical distress.    Average METs 4.97             Nutrition:  Target Goals: Understanding of nutrition guidelines, daily intake of sodium 1500mg , cholesterol 200mg , calories 30% from fat and 7% or less from saturated fats, daily to have 5 or more servings of fruits and vegetables.  Education: All About Nutrition: -Group instruction provided by verbal, written material, interactive activities, discussions, models, and posters to present general guidelines for heart healthy nutrition including fat, fiber, MyPlate, the role of sodium in heart healthy nutrition, utilization of the nutrition label, and utilization of this knowledge for meal planning. Follow up email sent as well. Written material given at graduation.   Biometrics:  Pre Biometrics - 02/07/23 1640       Pre Biometrics   Height 5\' 8"  (1.727 m)    Weight 187 lb 9.6 oz (85.1 kg)    Waist Circumference 39.5 inches    Hip Circumference 38.2 inches    Waist to Hip Ratio 1.03 %    BMI (Calculated) 28.53    Single Leg Stand 30 seconds              Nutrition Therapy Plan and Nutrition Goals:  Nutrition Therapy & Goals - 02/07/23 1643       Personal Nutrition Goals   Nutrition Goal Will meet with RD once work  schedule is set after going back into the office      Intervention Plan   Intervention Prescribe, educate and counsel regarding individualized specific dietary modifications aiming towards targeted core components such as weight, hypertension, lipid management, diabetes, heart failure and other comorbidities.;Nutrition handout(s) given to patient.    Expected Outcomes Short Term Goal: Understand basic principles of dietary content, such as calories, fat, sodium, cholesterol and nutrients.;Long Term Goal: Adherence to prescribed nutrition plan.;Short Term Goal: A plan has been developed with personal nutrition goals set during dietitian appointment.             Nutrition Assessments:  MEDIFICTS Score Key: >=70 Need to make dietary changes  40-70 Heart Healthy Diet <= 40 Therapeutic Level Cholesterol Diet  Flowsheet Row Cardiac Rehab from 02/07/2023 in [facility] Cardiac and Pulmonary Rehab  Picture Your Plate Total Score on Admission 59      Picture Your Plate Scores: <42 Unhealthy dietary pattern with much room for improvement. 41-50 Dietary pattern unlikely to meet recommendations for good health and room for improvement. 51-60 More healthful dietary pattern, with some room for improvement.  >60 Healthy dietary pattern, although there may be some specific behaviors that could be improved.    Nutrition Goals Re-Evaluation:   Nutrition Goals Discharge (Final Nutrition Goals Re-Evaluation):   Psychosocial: Target Goals: Acknowledge presence or absence of significant depression and/or stress, maximize coping skills, provide positive support system. Participant is able to verbalize types and ability to use techniques and skills needed for reducing stress and depression.   Education: Stress, Anxiety, and Depression - Group verbal and visual presentation to define topics covered.  Reviews how body is impacted by stress, anxiety, and depression.  Also discusses healthy ways to reduce  stress and to treat/manage anxiety and depression.  Written material given at graduation.   Education: Sleep Hygiene -Provides group verbal and written instruction about how  sleep can affect your health.  Define sleep hygiene, discuss sleep cycles and impact of sleep habits. Review good sleep hygiene tips.    Initial Review & Psychosocial Screening:  Initial Psych Review & Screening - 02/03/23 1341       Initial Review   Current issues with Current Stress Concerns    Comments healing from MI      Family Dynamics   Good Support System? Yes   family     Barriers   Psychosocial barriers to participate in program There are no identifiable barriers or psychosocial needs.;The patient should benefit from training in stress management and relaxation.      Screening Interventions   Interventions Encouraged to exercise;Provide feedback about the scores to participant;To provide support and resources with identified psychosocial needs    Expected Outcomes Short Term goal: Utilizing psychosocial counselor, staff and physician to assist with identification of specific Stressors or current issues interfering with healing process. Setting desired goal for each stressor or current issue identified.;Long Term Goal: Stressors or current issues are controlled or eliminated.;Short Term goal: Identification and review with participant of any Quality of Life or Depression concerns found by scoring the questionnaire.;Long Term goal: The participant improves quality of Life and PHQ9 Scores as seen by post scores and/or verbalization of changes             Quality of Life Scores:   Quality of Life - 02/07/23 1642       Quality of Life   Select Quality of Life      Quality of Life Scores   Health/Function Pre 27.83 %    Socioeconomic Pre 28.13 %    Psych/Spiritual Pre 27.21 %    Family Pre 27 %    GLOBAL Pre 27.66 %            Scores of 19 and below usually indicate a poorer quality of life  in these areas.  A difference of  2-3 points is a clinically meaningful difference.  A difference of 2-3 points in the total score of the Quality of Life Index has been associated with significant improvement in overall quality of life, self-image, physical symptoms, and general health in studies assessing change in quality of life.  PHQ-9: Review Flowsheet       02/07/2023 02/02/2023 06/24/2020  Depression screen PHQ 2/9  Decreased Interest 0 0 0  Down, Depressed, Hopeless 0 0 0  PHQ - 2 Score 0 0 0  Altered sleeping 0 - -  Tired, decreased energy 0 - -  Change in appetite 0 - -  Feeling bad or failure about yourself  0 - -  Trouble concentrating 0 - -  Moving slowly or fidgety/restless 0 - -  Suicidal thoughts 0 - -  PHQ-9 Score 0 - -    Details           Interpretation of Total Score  Total Score Depression Severity:  1-4 = Minimal depression, 5-9 = Mild depression, 10-14 = Moderate depression, 15-19 = Moderately severe depression, 20-27 = Severe depression   Psychosocial Evaluation and Intervention:  Psychosocial Evaluation - 02/03/23 1349       Psychosocial Evaluation & Interventions   Comments Mr. Wiberg is coming to cardiac rehab after a type 2 MI/ SCAD. This event came as a surprise to him and has been stressful learning how to recover from. He lives a very active lifestyle, including exercising regularly, working on cars, water sports, and yard work. He  plans on returning to work next week by easing into working from home some and then the office more. He has a very supportive family. He wants to work on his stamina and education related to living  heart healthy lifestyle. He is ready to get back to his usual exercise routine and is grateful for the heart monitoring in the program.    Expected Outcomes Short: attend cardiac rehab for education and exercise. Long; Develop and maitnain positive self care habits.    Continue Psychosocial Services  Follow up required by staff              Psychosocial Re-Evaluation:   Psychosocial Discharge (Final Psychosocial Re-Evaluation):   Vocational Rehabilitation: Provide vocational rehab assistance to qualifying candidates.   Vocational Rehab Evaluation & Intervention:  Vocational Rehab - 02/07/23 1644       Initial Vocational Rehab Evaluation & Intervention   Assessment shows need for Vocational Rehabilitation No             Education: Education Goals: Education classes will be provided on a variety of topics geared toward better understanding of heart health and risk factor modification. Participant will state understanding/return demonstration of topics presented as noted by education test scores.  Learning Barriers/Preferences:  Learning Barriers/Preferences - 02/03/23 1340       Learning Barriers/Preferences   Learning Barriers None    Learning Preferences None             General Cardiac Education Topics:  AED/CPR: - Group verbal and written instruction with the use of models to demonstrate the basic use of the AED with the basic ABC's of resuscitation.   Anatomy and Cardiac Procedures: - Group verbal and visual presentation and models provide information about basic cardiac anatomy and function. Reviews the testing methods done to diagnose heart disease and the outcomes of the test results. Describes the treatment choices: Medical Management, Angioplasty, or Coronary Bypass Surgery for treating various heart conditions including Myocardial Infarction, Angina, Valve Disease, and Cardiac Arrhythmias.  Written material given at graduation. Flowsheet Row Cardiac Rehab from 02/07/2023 in [facility] Cardiac and Pulmonary Rehab  Education need identified 02/07/23       Medication Safety: - Group verbal and visual instruction to review commonly prescribed medications for heart and lung disease. Reviews the medication, class of the drug, and side effects. Includes the steps to properly store meds  and maintain the prescription regimen.  Written material given at graduation.   Intimacy: - Group verbal instruction through game format to discuss how heart and lung disease can affect sexual intimacy. Written material given at graduation..   Know Your Numbers and Heart Failure: - Group verbal and visual instruction to discuss disease risk factors for cardiac and pulmonary disease and treatment options.  Reviews associated critical values for Overweight/Obesity, Hypertension, Cholesterol, and Diabetes.  Discusses basics of heart failure: signs/symptoms and treatments.  Introduces Heart Failure Zone chart for action plan for heart failure.  Written material given at graduation.   Infection Prevention: - Provides verbal and written material to individual with discussion of infection control including proper hand washing and proper equipment cleaning during exercise session. Flowsheet Row Cardiac Rehab from 02/07/2023 in [facility] Cardiac and Pulmonary Rehab  Date 02/07/23  Educator MB  Instruction Review Code 1- Verbalizes Understanding       Falls Prevention: - Provides verbal and written material to individual with discussion of falls prevention and safety. Flowsheet Row Cardiac Rehab from 02/07/2023 in [facility] Cardiac and Pulmonary  Rehab  Date 02/07/23  Educator MB  Instruction Review Code 1- Verbalizes Understanding       Other: -Provides group and verbal instruction on various topics (see comments)   Knowledge Questionnaire Score:  Knowledge Questionnaire Score - 02/07/23 1644       Knowledge Questionnaire Score   Pre Score 25/26             Core Components/Risk Factors/Patient Goals at Admission:  Personal Goals and Risk Factors at Admission - 02/07/23 1645       Core Components/Risk Factors/Patient Goals on Admission    Weight Management Yes;Weight Loss    Intervention Weight Management: Develop a combined nutrition and exercise program designed to reach desired  caloric intake, while maintaining appropriate intake of nutrient and fiber, sodium and fats, and appropriate energy expenditure required for the weight goal.;Weight Management: Provide education and appropriate resources to help participant work on and attain dietary goals.;Weight Management/Obesity: Establish reasonable short term and long term weight goals.    Admit Weight 187 lb 9.6 oz (85.1 kg)    Goal Weight: Short Term 182 lb 9.6 oz (82.8 kg)    Goal Weight: Long Term 177 lb 9.6 oz (80.6 kg)    Expected Outcomes Short Term: Continue to assess and modify interventions until short term weight is achieved;Long Term: Adherence to nutrition and physical activity/exercise program aimed toward attainment of established weight goal;Weight Loss: Understanding of general recommendations for a balanced deficit meal plan, which promotes 1-2 lb weight loss per week and includes a negative energy balance of [phone] kcal/d;Understanding recommendations for meals to include 15-35% energy as protein, 25-35% energy from fat, 35-60% energy from carbohydrates, less than 200mg  of dietary cholesterol, 20-35 gm of total fiber daily;Understanding of distribution of calorie intake throughout the day with the consumption of 4-5 meals/snacks    Hypertension Yes    Intervention Monitor prescription use compliance.;Provide education on lifestyle modifcations including regular physical activity/exercise, weight management, moderate sodium restriction and increased consumption of fresh fruit, vegetables, and low fat dairy, alcohol moderation, and smoking cessation.    Expected Outcomes Short Term: Continued assessment and intervention until BP is < 140/68mm HG in hypertensive participants. < 130/43mm HG in hypertensive participants with diabetes, heart failure or chronic kidney disease.;Long Term: Maintenance of blood pressure at goal levels.    Lipids Yes    Intervention Provide education and support for participant on nutrition  & aerobic/resistive exercise along with prescribed medications to achieve LDL 70mg , HDL >40mg .    Expected Outcomes Short Term: Participant states understanding of desired cholesterol values and is compliant with medications prescribed. Participant is following exercise prescription and nutrition guidelines.;Long Term: Cholesterol controlled with medications as prescribed, with individualized exercise RX and with personalized nutrition plan. Value goals: LDL < 70mg , HDL > 40 mg.             Education:Diabetes - Individual verbal and written instruction to review signs/symptoms of diabetes, desired ranges of glucose level fasting, after meals and with exercise. Acknowledge that pre and post exercise glucose checks will be done for 3 sessions at entry of program.   Core Components/Risk Factors/Patient Goals Review:    Core Components/Risk Factors/Patient Goals at Discharge (Final Review):    ITP Comments:  ITP Comments     Row Name 02/03/23 1339 02/07/23 1633 02/15/23 1112 02/23/23 0959     ITP Comments Initial phone call completed. Diagnosis can be found in [facility] 9/1. EP Orientation scheduled for Monday, 9/16 at 2:30 pm. Completed  and gym orientation. Initial ITP created and sent for review to Dr. Bethann Punches, Medical Director. First full day of exercise!  Patient was oriented to gym and equipment including functions, settings, policies, and procedures.  Patient's individual exercise prescription and treatment plan were reviewed.  All starting workloads were established based on the results of the 6 minute walk test done at initial orientation visit.  The plan for exercise progression was also introduced and progression will be customized based on patient's performance and goals. 30 Day review completed. Medical Director ITP review done, changes made as directed, and signed approval by Medical Director.   new to program             Comments:

## 2023-02-24 ENCOUNTER — Encounter: Payer: BC Managed Care – PPO | Admitting: *Deleted

## 2023-02-24 DIAGNOSIS — I21A1 Myocardial infarction type 2: Secondary | ICD-10-CM

## 2023-02-24 DIAGNOSIS — Z48812 Encounter for surgical aftercare following surgery on the circulatory system: Secondary | ICD-10-CM | POA: Diagnosis not present

## 2023-02-24 NOTE — Progress Notes (Signed)
Daily Session Note  Patient Details  Name: MASYN ROSTRO MRN: [id number] Date of Birth: [birth year] Referring Provider:   Flowsheet Row Cardiac Rehab from 02/07/2023 in [facility] Cardiac and Pulmonary Rehab  Referring Provider Julien Nordmann       Encounter Date: 02/24/2023  Check In:  Session Check In - 02/24/23 1114       Check-In   Supervising physician immediately available to respond to emergencies See telemetry face sheet for immediately available ER MD    Location [facility]-Cardiac & Pulmonary Rehab    Staff Present Elige Ko, RCP,RRT,BSRT;Meredith Jewel Baize, RN BSN;Noah Tickle, BS, Exercise Physiologist;Elizeth Weinrich Katrinka Blazing, RN, ADN    Virtual Visit No    Medication changes reported     No    Fall or balance concerns reported    No    Warm-up and Cool-down Performed on first and last piece of equipment    Resistance Training Performed Yes    VAD Patient? No    PAD/SET Patient? No      Pain Assessment   Currently in Pain? No/denies                Social History   Tobacco Use  Smoking Status Never  Smokeless Tobacco Never    Goals Met:  Independence with exercise equipment Exercise tolerated well No report of concerns or symptoms today Strength training completed today  Goals Unmet:  Not Applicable  Comments: Pt able to follow exercise prescription today without complaint.  Will continue to monitor for progression.    Dr. Bethann Punches is Medical Director for [facility] Cardiac Rehabilitation.  Dr. Vida Rigger is Medical Director for [facility] Pulmonary Rehabilitation.

## 2023-03-01 ENCOUNTER — Encounter: Payer: BC Managed Care – PPO | Admitting: *Deleted

## 2023-03-01 DIAGNOSIS — I21A1 Myocardial infarction type 2: Secondary | ICD-10-CM | POA: Diagnosis not present

## 2023-03-01 DIAGNOSIS — Z48812 Encounter for surgical aftercare following surgery on the circulatory system: Secondary | ICD-10-CM | POA: Diagnosis not present

## 2023-03-01 NOTE — Progress Notes (Signed)
Daily Session Note  Patient Details  Name: Nicholas Arnold MRN: [id number] Date of Birth: 1965-12-30 Referring Provider:   Flowsheet Row Cardiac Rehab from 02/07/2023 in [facility] Cardiac and Pulmonary Rehab  Referring Provider Julien Nordmann       Encounter Date: 03/01/2023  Check In:  Session Check In - 03/01/23 1129       Check-In   Supervising physician immediately available to respond to emergencies See telemetry face sheet for immediately available ER MD    Location [facility]-Cardiac & Pulmonary Rehab    Staff Present Cora Collum, RN, BSN, CCRP;Meredith Jewel Baize, RN BSN;Maxon Conetta BS, , Exercise Physiologist;Jason Wallace Cullens, RDN, LDN    Virtual Visit No    Medication changes reported     No    Fall or balance concerns reported    No    Warm-up and Cool-down Performed on first and last piece of equipment    Resistance Training Performed Yes    VAD Patient? No    PAD/SET Patient? No      Pain Assessment   Currently in Pain? No/denies                Social History   Tobacco Use  Smoking Status Never  Smokeless Tobacco Never    Goals Met:  Independence with exercise equipment Exercise tolerated well No report of concerns or symptoms today  Goals Unmet:  Not Applicable  Comments: Pt able to follow exercise prescription today without complaint.  Will continue to monitor for progression.    Dr. Bethann Punches is Medical Director for [facility] Cardiac Rehabilitation.  Dr. Vida Rigger is Medical Director for [facility] Pulmonary Rehabilitation.

## 2023-03-03 ENCOUNTER — Encounter: Payer: BC Managed Care – PPO | Admitting: *Deleted

## 2023-03-03 DIAGNOSIS — I21A1 Myocardial infarction type 2: Secondary | ICD-10-CM | POA: Diagnosis not present

## 2023-03-03 DIAGNOSIS — Z48812 Encounter for surgical aftercare following surgery on the circulatory system: Secondary | ICD-10-CM | POA: Diagnosis not present

## 2023-03-03 NOTE — Progress Notes (Signed)
Daily Session Note  Patient Details  Name: MUAAD BOEHNING MRN: [id number] Date of Birth: 02/12/1966 Referring Provider:   Flowsheet Row Cardiac Rehab from 02/07/2023 in [facility] Cardiac and Pulmonary Rehab  Referring Provider Julien Nordmann       Encounter Date: 03/03/2023  Check In:  Session Check In - 03/03/23 1106       Check-In   Supervising physician immediately available to respond to emergencies See telemetry face sheet for immediately available ER MD    Location [facility]-Cardiac & Pulmonary Rehab    Staff Present Ronette Deter, BS, Exercise Physiologist;Meredith Jewel Baize, RN BSN;Joseph Shelbie Proctor, RN, [place]    Virtual Visit No    Medication changes reported     No    Fall or balance concerns reported    No    Warm-up and Cool-down Performed on first and last piece of equipment    Resistance Training Performed Yes    VAD Patient? No    PAD/SET Patient? No      Pain Assessment   Currently in Pain? No/denies                Social History   Tobacco Use  Smoking Status Never  Smokeless Tobacco Never    Goals Met:  Independence with exercise equipment Exercise tolerated well No report of concerns or symptoms today Strength training completed today  Goals Unmet:  Not Applicable  Comments: Pt able to follow exercise prescription today without complaint.  Will continue to monitor for progression.    Dr. Bethann Punches is Medical Director for [facility] Cardiac Rehabilitation.  Dr. Vida Rigger is Medical Director for [facility] Pulmonary Rehabilitation.

## 2023-03-08 ENCOUNTER — Encounter: Payer: BC Managed Care – PPO | Admitting: *Deleted

## 2023-03-08 DIAGNOSIS — I21A1 Myocardial infarction type 2: Secondary | ICD-10-CM

## 2023-03-08 DIAGNOSIS — Z48812 Encounter for surgical aftercare following surgery on the circulatory system: Secondary | ICD-10-CM | POA: Diagnosis not present

## 2023-03-08 NOTE — Progress Notes (Signed)
Daily Session Note  Patient Details  Name: Nicholas Arnold MRN: [id number] Date of Birth: 01-24-1966 Referring Provider:   Flowsheet Row Cardiac Rehab from 02/07/2023 in [facility] Cardiac and Pulmonary Rehab  Referring Provider Julien Nordmann       Encounter Date: 03/08/2023  Check In:  Session Check In - 03/08/23 0927       Check-In   Supervising physician immediately available to respond to emergencies See telemetry face sheet for immediately available ER MD    Location [facility]-Cardiac & Pulmonary Rehab    Staff Present Maxon Conetta BS, , Exercise Physiologist;Margaret Best, MS, Exercise Physiologist;Noah Tickle, BS, Exercise Physiologist;Seldon Barrell, RN, BSN, CCRP    Virtual Visit No    Medication changes reported     No    Fall or balance concerns reported    No    Warm-up and Cool-down Performed on first and last piece of equipment    Resistance Training Performed Yes    VAD Patient? No    PAD/SET Patient? No      Pain Assessment   Currently in Pain? No/denies                Social History   Tobacco Use  Smoking Status Never  Smokeless Tobacco Never    Goals Met:  Independence with exercise equipment Exercise tolerated well No report of concerns or symptoms today  Goals Unmet:  Not Applicable  Comments: Pt able to follow exercise prescription today without complaint.  Will continue to monitor for progression.    Dr. Bethann Punches is Medical Director for [facility] Cardiac Rehabilitation.  Dr. Vida Rigger is Medical Director for [facility] Pulmonary Rehabilitation.

## 2023-03-10 ENCOUNTER — Encounter: Payer: BC Managed Care – PPO | Admitting: *Deleted

## 2023-03-10 DIAGNOSIS — I21A1 Myocardial infarction type 2: Secondary | ICD-10-CM

## 2023-03-10 DIAGNOSIS — Z48812 Encounter for surgical aftercare following surgery on the circulatory system: Secondary | ICD-10-CM | POA: Diagnosis not present

## 2023-03-10 NOTE — Progress Notes (Signed)
Daily Session Note  Patient Details  Name: Nicholas Arnold MRN: [id number] Date of Birth: 1965/08/02 Referring Provider:   Flowsheet Row Cardiac Rehab from 02/07/2023 in [facility] Cardiac and Pulmonary Rehab  Referring Provider Julien Nordmann       Encounter Date: 03/10/2023  Check In:  Session Check In - 03/10/23 0903       Check-In   Supervising physician immediately available to respond to emergencies See telemetry face sheet for immediately available ER MD    Location [facility]-Cardiac & Pulmonary Rehab    Staff Present Cora Collum, RN, BSN, CCRP;Joseph Hood, RCP,RRT,BSRT;Maxon Newport BS, , Exercise Physiologist;Disha Cottam Katrinka Blazing, RN, [place]    Virtual Visit No    Medication changes reported     No    Fall or balance concerns reported    No    Resistance Training Performed Yes    VAD Patient? No    PAD/SET Patient? No      Pain Assessment   Currently in Pain? No/denies                Social History   Tobacco Use  Smoking Status Never  Smokeless Tobacco Never    Goals Met:  Independence with exercise equipment Exercise tolerated well No report of concerns or symptoms today Strength training completed today  Goals Unmet:  Not Applicable  Comments: Pt able to follow exercise prescription today without complaint.  Will continue to monitor for progression.    Dr. Bethann Punches is Medical Director for [facility] Cardiac Rehabilitation.  Dr. Vida Rigger is Medical Director for [facility] Pulmonary Rehabilitation.

## 2023-03-15 ENCOUNTER — Encounter: Payer: BC Managed Care – PPO | Admitting: *Deleted

## 2023-03-15 DIAGNOSIS — I21A1 Myocardial infarction type 2: Secondary | ICD-10-CM

## 2023-03-15 DIAGNOSIS — Z48812 Encounter for surgical aftercare following surgery on the circulatory system: Secondary | ICD-10-CM | POA: Diagnosis not present

## 2023-03-15 NOTE — Progress Notes (Signed)
Daily Session Note  Patient Details  Name: Nicholas Arnold MRN: [id number] Date of Birth: 1966/03/31 Referring Provider:   Flowsheet Row Cardiac Rehab from 02/07/2023 in [facility] Cardiac and Pulmonary Rehab  Referring Provider Julien Nordmann       Encounter Date: 03/15/2023  Check In:  Session Check In - 03/15/23 4403       Check-In   Supervising physician immediately available to respond to emergencies See telemetry face sheet for immediately available ER MD    Location [facility]-Cardiac & Pulmonary Rehab    Staff Present Cora Collum, RN, BSN, CCRP;Margaret Best, MS, Exercise Physiologist;Noah Tickle, BS, Exercise Physiologist;Maxon Conetta BS, , Exercise Physiologist    Virtual Visit No    Medication changes reported     No    Fall or balance concerns reported    No    Warm-up and Cool-down Performed on first and last piece of equipment    Resistance Training Performed Yes    VAD Patient? No    PAD/SET Patient? No      Pain Assessment   Currently in Pain? No/denies                Social History   Tobacco Use  Smoking Status Never  Smokeless Tobacco Never    Goals Met:  Independence with exercise equipment Exercise tolerated well No report of concerns or symptoms today  Goals Unmet:  Not Applicable  Comments: Pt able to follow exercise prescription today without complaint.  Will continue to monitor for progression.     Dr. Bethann Punches is Medical Director for [facility] Cardiac Rehabilitation.  Dr. Vida Rigger is Medical Director for [facility] Pulmonary Rehabilitation.

## 2023-03-17 ENCOUNTER — Encounter: Payer: BC Managed Care – PPO | Admitting: *Deleted

## 2023-03-17 DIAGNOSIS — I21A1 Myocardial infarction type 2: Secondary | ICD-10-CM

## 2023-03-17 DIAGNOSIS — Z48812 Encounter for surgical aftercare following surgery on the circulatory system: Secondary | ICD-10-CM | POA: Diagnosis not present

## 2023-03-17 NOTE — Progress Notes (Signed)
Daily Session Note  Patient Details  Name: Nicholas Arnold MRN: [id number] Date of Birth: April 14, 1966 Referring Provider:   Flowsheet Row Cardiac Rehab from 02/07/2023 in [facility] Cardiac and Pulmonary Rehab  Referring Provider Julien Nordmann       Encounter Date: 03/17/2023  Check In:  Session Check In - 03/17/23 0918       Check-In   Supervising physician immediately available to respond to emergencies See telemetry face sheet for immediately available ER MD    Location [facility]-Cardiac & Pulmonary Rehab    Staff Present Cora Collum, RN, BSN, CCRP;Joseph Hood, RCP,RRT,BSRT;Maxon Troy BS, , Exercise Physiologist;Jerzi Tigert Katrinka Blazing, RN, [place]    Virtual Visit No    Medication changes reported     No    Fall or balance concerns reported    No    Warm-up and Cool-down Performed on first and last piece of equipment    Resistance Training Performed Yes    VAD Patient? No    PAD/SET Patient? No      Pain Assessment   Currently in Pain? No/denies                Social History   Tobacco Use  Smoking Status Never  Smokeless Tobacco Never    Goals Met:  Independence with exercise equipment Exercise tolerated well No report of concerns or symptoms today Strength training completed today  Goals Unmet:  Not Applicable  Comments: Pt able to follow exercise prescription today without complaint.  Will continue to monitor for progression.    Dr. Bethann Punches is Medical Director for [facility] Cardiac Rehabilitation.  Dr. Vida Rigger is Medical Director for [facility] Pulmonary Rehabilitation.

## 2023-03-22 ENCOUNTER — Encounter: Payer: BC Managed Care – PPO | Admitting: *Deleted

## 2023-03-22 DIAGNOSIS — Z48812 Encounter for surgical aftercare following surgery on the circulatory system: Secondary | ICD-10-CM | POA: Diagnosis not present

## 2023-03-22 DIAGNOSIS — I21A1 Myocardial infarction type 2: Secondary | ICD-10-CM

## 2023-03-22 IMAGING — CR DG CHEST 2V
1 series · 2 of 2 positions shown · non-contrast
Comparison: 07/09/2005

CLINICAL DATA: 54-year-old male with persisting cough

EXAM:
CHEST - 2 VIEW

[Series 1: dg chest 2 view · 0.14mm/px · 2 of 2 slices shown]
[im 1/2]
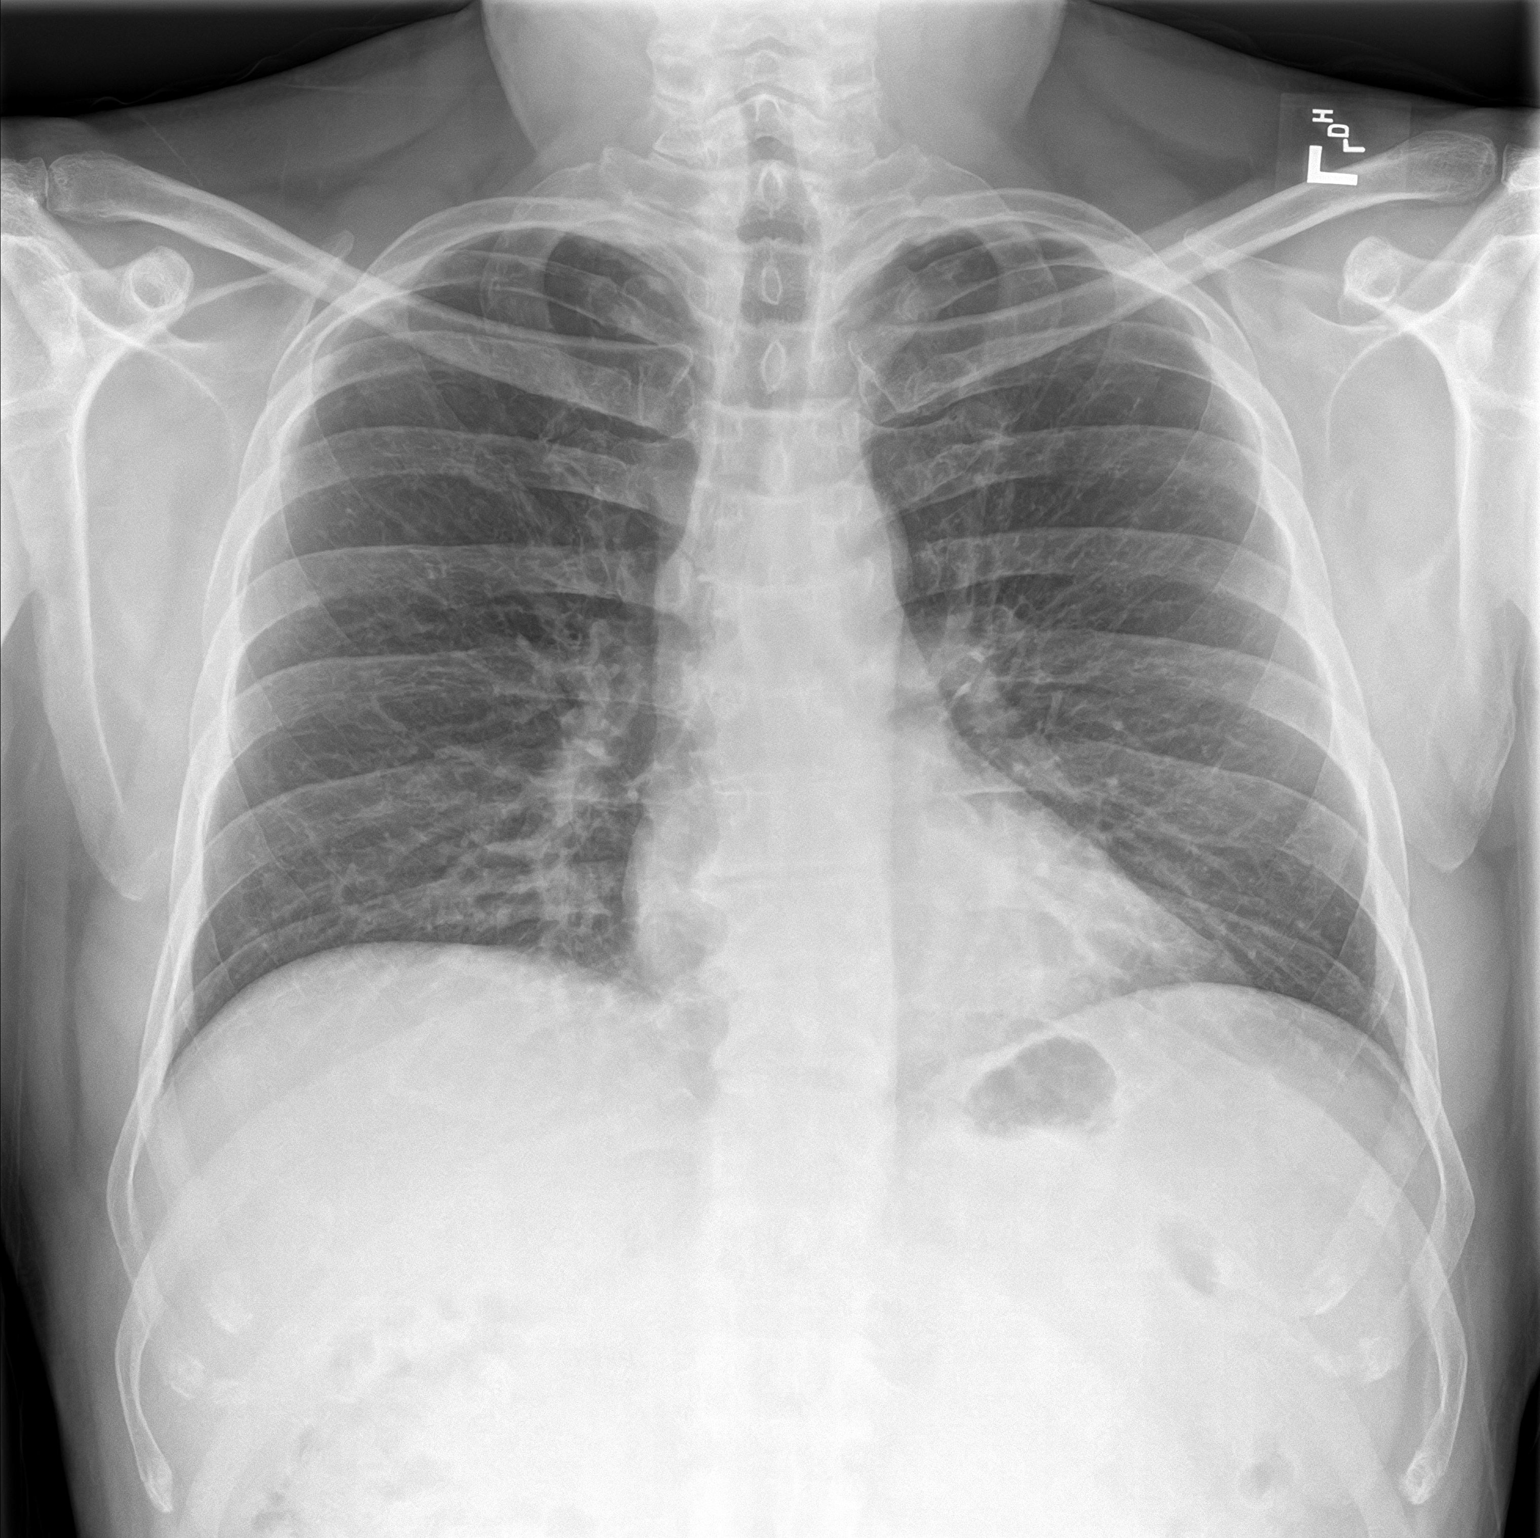
[im 2/2]
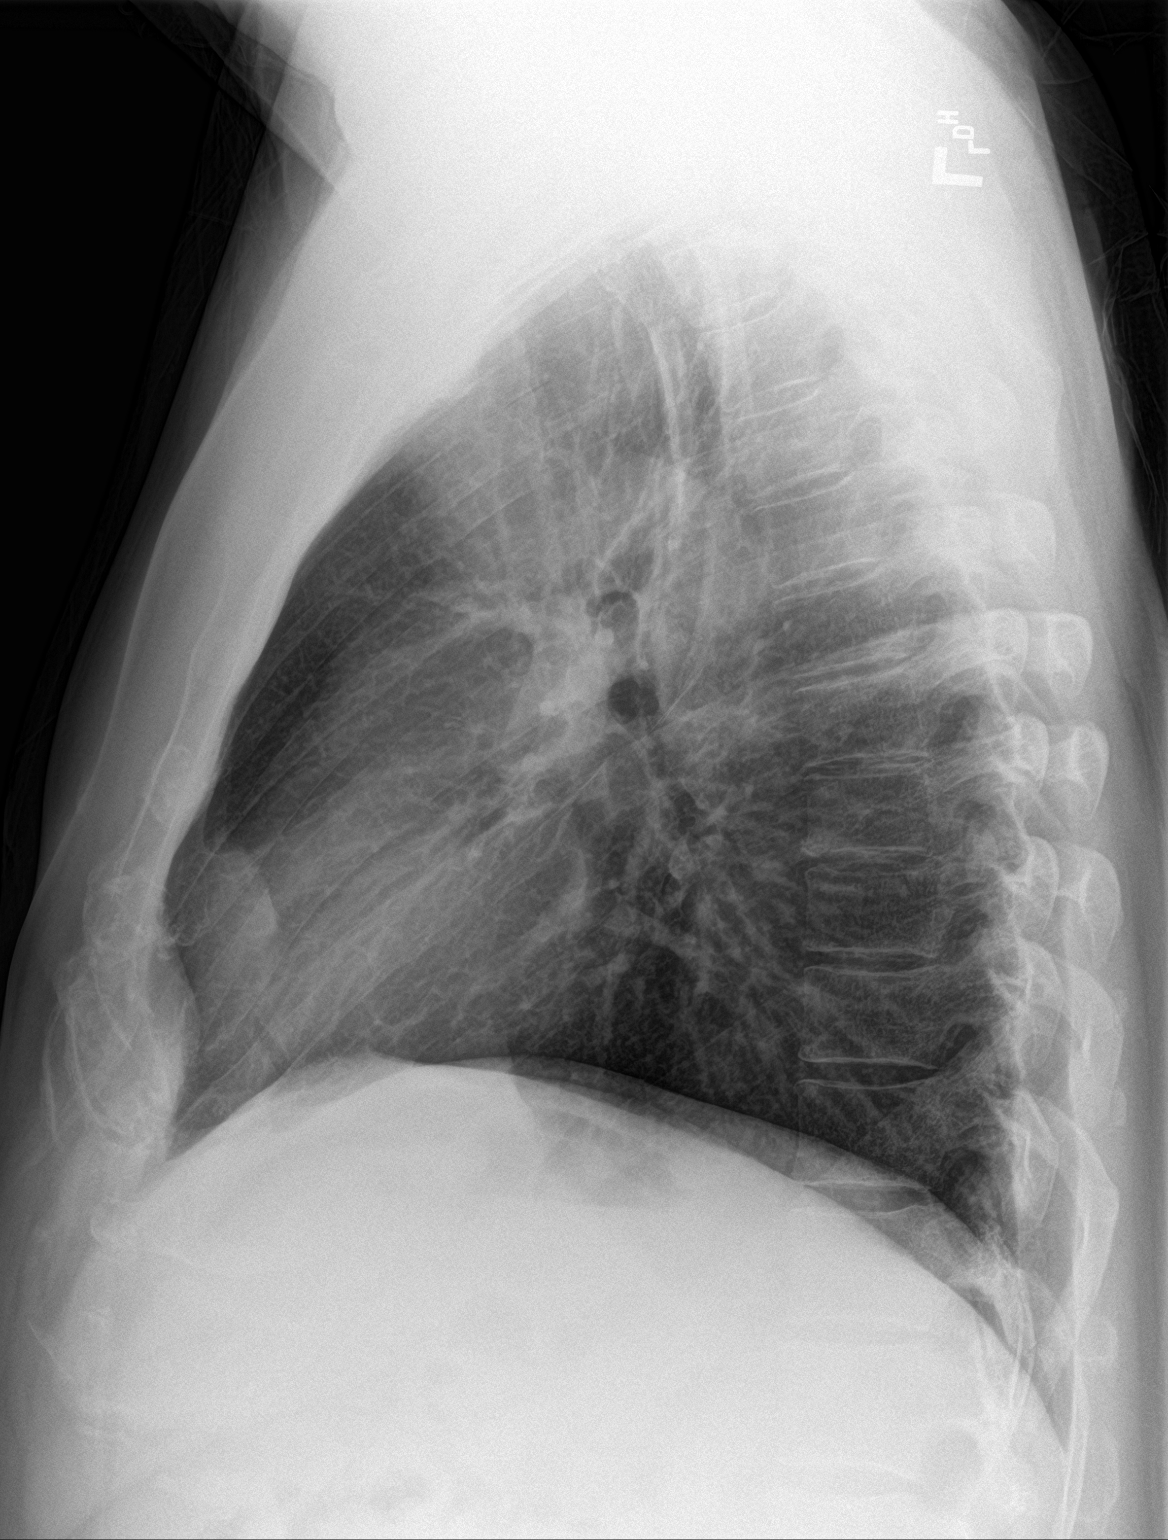

[2 of 2 positions shown; findings below may reference images not displayed]

FINDINGS: Cardiomediastinal silhouette unchanged in size and contour. No
evidence of central vascular congestion. No interlobular septal
thickening.

No pneumothorax or pleural effusion. Coarsened interstitial
markings, with no confluent airspace disease.

No acute displaced fracture. Degenerative changes of the spine.
IMPRESSION: No active cardiopulmonary disease.

## 2023-03-22 NOTE — Progress Notes (Signed)
Daily Session Note  Patient Details  Name: Nicholas Arnold MRN: [id number] Date of Birth: [birth year] Referring Provider:   Flowsheet Row Cardiac Rehab from 02/07/2023 in [facility] Cardiac and Pulmonary Rehab  Referring Provider Julien Nordmann       Encounter Date: 03/22/2023  Check In:  Session Check In - 03/22/23 0940       Check-In   Supervising physician immediately available to respond to emergencies See telemetry face sheet for immediately available ER MD    Location [facility]-Cardiac & Pulmonary Rehab    Staff Present Cora Collum, RN, BSN, CCRP;Margaret Best, MS, Exercise Physiologist;Noah Tickle, BS, Exercise Physiologist;Maxon Conetta BS, , Exercise Physiologist    Virtual Visit No    Medication changes reported     No    Fall or balance concerns reported    No    Warm-up and Cool-down Performed on first and last piece of equipment    Resistance Training Performed Yes    VAD Patient? No    PAD/SET Patient? No      Pain Assessment   Currently in Pain? No/denies                Social History   Tobacco Use  Smoking Status Never  Smokeless Tobacco Never    Goals Met:  Independence with exercise equipment Exercise tolerated well No report of concerns or symptoms today  Goals Unmet:  Not Applicable  Comments: Pt able to follow exercise prescription today without complaint.  Will continue to monitor for progression.    Dr. Bethann Punches is Medical Director for [facility] Cardiac Rehabilitation.  Dr. Vida Rigger is Medical Director for [facility] Pulmonary Rehabilitation.

## 2023-03-23 ENCOUNTER — Encounter: Payer: Self-pay | Admitting: *Deleted

## 2023-03-23 DIAGNOSIS — I21A1 Myocardial infarction type 2: Secondary | ICD-10-CM

## 2023-03-23 NOTE — Progress Notes (Signed)
Cardiac Individual Treatment Plan  Patient Details  Name: Nicholas Arnold MRN: [id number] Date of Birth: 11/11/1965 Referring Provider:   Flowsheet Row Cardiac Rehab from 02/07/2023 in [facility] Cardiac and Pulmonary Rehab  Referring Provider Julien Nordmann       Initial Encounter Date:  Flowsheet Row Cardiac Rehab from 02/07/2023 in [facility] Cardiac and Pulmonary Rehab  Date 02/07/23       Visit Diagnosis: Type 2 MI (myocardial infarction) (HCC)  Patient's Home Medications on Admission:  Current Outpatient Medications:    ascorbic acid (VITAMIN C) 250 MG CHEW, Chew 2 tablets by mouth daily., Disp: , Rfl:    aspirin EC 81 MG tablet, Take 81 mg by mouth daily. Swallow whole., Disp: , Rfl:    atorvastatin (LIPITOR) 80 MG tablet, Take 1 tablet (80 mg total) by mouth daily., Disp: 90 tablet, Rfl: 3   Cholecalciferol 10 MCG (400 UNIT) CHEW, Chew 2 tablets by mouth daily., Disp: , Rfl:    metoprolol succinate (TOPROL-XL) 25 MG 24 hr tablet, Take 4 tablets (100 mg total) by mouth daily. Take with or immediately following a meal., Disp: 90 tablet, Rfl: 3  Past Medical History: Past Medical History:  Diagnosis Date   Chickenpox    GERD (gastroesophageal reflux disease)    Hyperlipemia    Neck mass    removed in 2012, benign   Prediabetes    Pyloric stenosis     Tobacco Use: Social History   Tobacco Use  Smoking Status Never  Smokeless Tobacco Never    Labs: Review Flowsheet  More data may exist      Latest Ref Rng & Units 03/12/2015 06/13/2015 08/13/2016 02/08/2017 06/24/2020  Labs for ITP Cardiac and Pulmonary Rehab  Cholestrol 0 - 200 mg/dL 413  244  010  272  536   LDL (calc) 0 - 99 mg/dL 644  034  742  595  638   HDL-C >39.00 mg/dL 75.64  33.29  51.88  41.66  46.40   Trlycerides 0.0 - 149.0 mg/dL 063.0  16.0  109.3  235.5  98.0   Hemoglobin A1c 4.6 - 6.5 % 5.6  - 5.8  5.6  5.7     Details             Exercise Target Goals: Exercise Program Goal: Individual exercise  prescription set using results from initial 6 min walk test and THRR while considering  patient's activity barriers and safety.   Exercise Prescription Goal: Initial exercise prescription builds to 30-45 minutes a day of aerobic activity, 2-3 days per week.  Home exercise guidelines will be given to patient during program as part of exercise prescription that the participant will acknowledge.   Education: Aerobic Exercise: - Group verbal and visual presentation on the components of exercise prescription. Introduces F.I.T.T principle from ACSM for exercise prescriptions.  Reviews F.I.T.T. principles of aerobic exercise including progression. Written material given at graduation.   Education: Resistance Exercise: - Group verbal and visual presentation on the components of exercise prescription. Introduces F.I.T.T principle from ACSM for exercise prescriptions  Reviews F.I.T.T. principles of resistance exercise including progression. Written material given at graduation. Flowsheet Row Cardiac Rehab from 03/17/2023 in [facility] Cardiac and Pulmonary Rehab  Date 03/17/23  Educator MB  Instruction Review Code 1- [employer] Understanding        Education: Exercise & Equipment Safety: - Individual verbal instruction and demonstration of equipment use and safety with use of the equipment. Flowsheet Row Cardiac Rehab from 03/17/2023 in [facility]  Cardiac and Pulmonary Rehab  Date 02/07/23  Educator MB  Instruction Review Code 1- Verbalizes Understanding       Education: Exercise Physiology & General Exercise Guidelines: - Group verbal and written instruction with models to review the exercise physiology of the cardiovascular system and associated critical values. Provides general exercise guidelines with specific guidelines to those with heart or lung disease.  Flowsheet Row Cardiac Rehab from 03/17/2023 in [facility] Cardiac and Pulmonary Rehab  Date 03/10/23  Educator MB  Instruction Review Code 1-  [employer] Understanding       Education: Flexibility, Balance, Mind/Body Relaxation: - Group verbal and visual presentation with interactive activity on the components of exercise prescription. Introduces F.I.T.T principle from ACSM for exercise prescriptions. Reviews F.I.T.T. principles of flexibility and balance exercise training including progression. Also discusses the mind body connection.  Reviews various relaxation techniques to help reduce and manage stress (i.e. Deep breathing, progressive muscle relaxation, and visualization). Balance handout provided to take home. Written material given at graduation. Flowsheet Row Cardiac Rehab from 03/17/2023 in [facility] Cardiac and Pulmonary Rehab  Date 03/17/23  Educator MB  Instruction Review Code 1- Verbalizes Understanding       Activity Barriers & Risk Stratification:  Activity Barriers & Cardiac Risk Stratification - 02/07/23 1635       Activity Barriers & Cardiac Risk Stratification   Activity Barriers None    Cardiac Risk Stratification Moderate             6 Minute Walk:  6 Minute Walk     Row Name 02/07/23 1633         6 Minute Walk   Phase Initial     Distance 1890 feet     Walk Time 6 minutes     # of Rest Breaks 0     MPH 3.58     METS 4.97     RPE 7     Perceived Dyspnea  0     VO2 Peak 17.39     Symptoms No     Resting HR 67 bpm     Resting BP 92/62     Resting Oxygen Saturation  98 %     Exercise Oxygen Saturation  during 6 min walk 99 %     Max Ex. HR 127 bpm     Max Ex. BP 126/70     2 Minute Post BP 112/74              Oxygen Initial Assessment:   Oxygen Re-Evaluation:   Oxygen Discharge (Final Oxygen Re-Evaluation):   Initial Exercise Prescription:  Initial Exercise Prescription - 02/07/23 1600       Date of Initial Exercise RX and Referring Provider   Date 02/07/23    Referring Provider Julien Nordmann      Oxygen   Maintain Oxygen Saturation 88% or higher      Treadmill    MPH 3.8    Grade 0    Minutes 15    METs 3.91      Elliptical   Level 2    Speed 3    Minutes 15    METs 4.97      T5 Nustep   Level 4    SPM 80    Minutes 15    METs 4.97      Prescription Details   Frequency (times per week) 2    Duration Progress to 30 minutes of continuous aerobic without signs/symptoms of physical distress  Intensity   THRR 40-80% of Max Heartrate 106-146    Ratings of Perceived Exertion 11-13    Perceived Dyspnea 0-4      Progression   Progression Continue to progress workloads to maintain intensity without signs/symptoms of physical distress.      Resistance Training   Training Prescription Yes    Weight 10 lb    Reps 10-15             Perform Capillary Blood Glucose checks as needed.  Exercise Prescription Changes:   Exercise Prescription Changes     Row Name 02/07/23 1600 02/24/23 1700 03/10/23 1400         Response to Exercise   Blood Pressure (Admit) 92/62 110/68 118/78     Blood Pressure (Exercise) 126/70 132/68 160/70     Blood Pressure (Exit) 112/74 114/64 124/72     Heart Rate (Admit) 67 bpm 65 bpm 66 bpm     Heart Rate (Exercise) 127 bpm 132 bpm 149 bpm     Heart Rate (Exit) 68 bpm 86 bpm 81 bpm     Oxygen Saturation (Admit) 98 % -- --     Oxygen Saturation (Exercise) 99 % -- --     Oxygen Saturation (Exit) 99 % -- --     Rating of Perceived Exertion (Exercise) 7 12 13      Perceived Dyspnea (Exercise) 0 0 0     Symptoms none none none     Comments results -- --     Duration Progress to 30 minutes of  aerobic without signs/symptoms of physical distress Progress to 30 minutes of  aerobic without signs/symptoms of physical distress Progress to 30 minutes of  aerobic without signs/symptoms of physical distress     Intensity THRR New THRR unchanged THRR unchanged       Progression   Progression Continue to progress workloads to maintain intensity without signs/symptoms of physical distress. Continue to  progress workloads to maintain intensity without signs/symptoms of physical distress. Continue to progress workloads to maintain intensity without signs/symptoms of physical distress.     Average METs 4.97 3.3 4.6       Resistance Training   Training Prescription -- Yes Yes     Weight -- 10 lb 10 lb     Reps -- 10-15 10-15       Interval Training   Interval Training -- No No       Oxygen   Oxygen -- Continuous --       Treadmill   MPH -- 3.9 4     Grade -- 0 2     Minutes -- 15 15     METs -- 3.99 5.17       Elliptical   Level -- 4 5     Speed -- 3.7 4.5     Minutes -- 15 15       T5 Nustep   Level -- 4 4     Minutes -- 15 15     METs -- 2.3 3.9       Oxygen   Maintain Oxygen Saturation -- 88% or higher 88% or higher              Exercise Comments:   Exercise Comments     Row Name 02/15/23 1112           Exercise Comments First full day of exercise!  Patient was oriented to gym and equipment including functions, settings, policies, and procedures.  Patient's individual exercise prescription and treatment plan were reviewed.  All starting workloads were established based on the results of the 6 minute walk test done at initial orientation visit.  The plan for exercise progression was also introduced and progression will be customized based on patient's performance and goals.                Exercise Goals and Review:   Exercise Goals     Row Name 02/07/23 1640             Exercise Goals   Increase Physical Activity Yes       Intervention Provide advice, education, support and counseling about physical activity/exercise needs.;Develop an individualized exercise prescription for aerobic and resistive training based on initial evaluation findings, risk stratification, comorbidities and participant's personal goals.       Expected Outcomes Short Term: Attend rehab on a regular basis to increase amount of physical activity.;Long Term: Exercising regularly  at least 3-5 days a week.;Long Term: Add in home exercise to make exercise part of routine and to increase amount of physical activity.       Increase Strength and Stamina Yes       Intervention Provide advice, education, support and counseling about physical activity/exercise needs.;Develop an individualized exercise prescription for aerobic and resistive training based on initial evaluation findings, risk stratification, comorbidities and participant's personal goals.       Expected Outcomes Short Term: Increase workloads from initial exercise prescription for resistance, speed, and METs.;Short Term: Perform resistance training exercises routinely during rehab and add in resistance training at home;Long Term: Improve cardiorespiratory fitness, muscular endurance and strength as measured by increased METs and functional capacity ( )       Able to understand and use rate of perceived exertion (RPE) scale Yes       Intervention Provide education and explanation on how to use RPE scale       Expected Outcomes Short Term: Able to use RPE daily in rehab to express subjective intensity level;Long Term:  Able to use RPE to guide intensity level when exercising independently       Able to understand and use Dyspnea scale Yes       Intervention Provide education and explanation on how to use Dyspnea scale       Expected Outcomes Short Term: Able to use Dyspnea scale daily in rehab to express subjective sense of shortness of breath during exertion;Long Term: Able to use Dyspnea scale to guide intensity level when exercising independently       Knowledge and understanding of Target Heart Rate Range (THRR) Yes       Intervention Provide education and explanation of THRR including how the numbers were predicted and where they are located for reference       Expected Outcomes Short Term: Able to state/look up THRR;Long Term: Able to use THRR to govern intensity when exercising independently;Short Term: Able to use  daily as guideline for intensity in rehab       Able to check pulse independently Yes       Intervention Provide education and demonstration on how to check pulse in carotid and radial arteries.;Review the importance of being able to check your own pulse for safety during independent exercise       Expected Outcomes Long Term: Able to check pulse independently and accurately;Short Term: Able to explain why pulse checking is important during independent exercise       Understanding of Exercise Prescription  Yes       Intervention Provide education, explanation, and written materials on patient's individual exercise prescription       Expected Outcomes Short Term: Able to explain program exercise prescription;Long Term: Able to explain home exercise prescription to exercise independently                Exercise Goals Re-Evaluation :  Exercise Goals Re-Evaluation     Row Name 02/15/23 1112 02/24/23 1733 03/10/23 1427         Exercise Goal Re-Evaluation   Exercise Goals Review Able to understand and use rate of perceived exertion (RPE) scale;Able to understand and use Dyspnea scale;Knowledge and understanding of Target Heart Rate Range (THRR);Understanding of Exercise Prescription Increase Physical Activity;Increase Strength and Stamina;Understanding of Exercise Prescription Increase Physical Activity;Increase Strength and Stamina;Understanding of Exercise Prescription     Comments Reviewed RPE and dyspnea scale, THR and program prescription with pt today.  Pt voiced understanding and was given a copy of goals to take home. Trey Paula is off to a great start in the program. He has attended his first 2 sessions during this review. He was able to increase his Treadmill speed from 3. to 3.31mph. We will continue to monitor his progress in the program. Trey Paula continues to do well in the program. He has increased his speed on the elliptical at level 1, from 3. to 4.66mph. He also increased his incline on  the treadmill from 0% to 2%. We will continue to monitor his progress in the program.     Expected Outcomes Short: Use RPE daily to regulate intensity. Long: Follow program prescription in THR. Short: Continue to follow current exercise prescription, and progressively increase workloads. Long: Continue exercise to improve strength and stamina. Short: Continue to follow current exercise prescription, and progressively increase workloads. Long: Continue exercise to improve strength and stamina.              Discharge Exercise Prescription (Final Exercise Prescription Changes):  Exercise Prescription Changes - 03/10/23 1400       Response to Exercise   Blood Pressure (Admit) 118/78    Blood Pressure (Exercise) 160/70    Blood Pressure (Exit) 124/72    Heart Rate (Admit) 66 bpm    Heart Rate (Exercise) 149 bpm    Heart Rate (Exit) 81 bpm    Rating of Perceived Exertion (Exercise) 13    Perceived Dyspnea (Exercise) 0    Symptoms none    Duration Progress to 30 minutes of  aerobic without signs/symptoms of physical distress    Intensity THRR unchanged      Progression   Progression Continue to progress workloads to maintain intensity without signs/symptoms of physical distress.    Average METs 4.6      Resistance Training   Training Prescription Yes    Weight 10 lb    Reps 10-15      Interval Training   Interval Training No      Treadmill   MPH 4    Grade 2    Minutes 15    METs 5.17      Elliptical   Level 5    Speed 4.5    Minutes 15      T5 Nustep   Level 4    Minutes 15    METs 3.9      Oxygen   Maintain Oxygen Saturation 88% or higher             Nutrition:  Target Goals: Understanding  of nutrition guidelines, daily intake of sodium 1500mg , cholesterol 200mg , calories 30% from fat and 7% or less from saturated fats, daily to have 5 or more servings of fruits and vegetables.  Education: All About Nutrition: -Group instruction provided by verbal,  written material, interactive activities, discussions, models, and posters to present general guidelines for heart healthy nutrition including fat, fiber, MyPlate, the role of sodium in heart healthy nutrition, utilization of the nutrition label, and utilization of this knowledge for meal planning. Follow up email sent as well. Written material given at graduation.   Biometrics:  Pre Biometrics - 02/07/23 1640       Pre Biometrics   Height 5\' 8"  (1.727 m)    Weight 187 lb 9.6 oz (85.1 kg)    Waist Circumference 39.5 inches    Hip Circumference 38.2 inches    Waist to Hip Ratio 1.03 %    BMI (Calculated) 28.53    Single Leg Stand 30 seconds              Nutrition Therapy Plan and Nutrition Goals:  Nutrition Therapy & Goals - 02/07/23 1643       Personal Nutrition Goals   Nutrition Goal Will meet with RD once work schedule is set after going back into the office      Intervention Plan   Intervention Prescribe, educate and counsel regarding individualized specific dietary modifications aiming towards targeted core components such as weight, hypertension, lipid management, diabetes, heart failure and other comorbidities.;Nutrition handout(s) given to patient.    Expected Outcomes Short Term Goal: Understand basic principles of dietary content, such as calories, fat, sodium, cholesterol and nutrients.;Long Term Goal: Adherence to prescribed nutrition plan.;Short Term Goal: A plan has been developed with personal nutrition goals set during dietitian appointment.             Nutrition Assessments:  MEDIFICTS Score Key: >=70 Need to make dietary changes  40-70 Heart Healthy Diet <= 40 Therapeutic Level Cholesterol Diet  Flowsheet Row Cardiac Rehab from 02/07/2023 in [facility] Cardiac and Pulmonary Rehab  Picture Your Plate Total Score on Admission 59      Picture Your Plate Scores: <28 Unhealthy dietary pattern with much room for improvement. 41-50 Dietary pattern unlikely to  meet recommendations for good health and room for improvement. 51-60 More healthful dietary pattern, with some room for improvement.  >60 Healthy dietary pattern, although there may be some specific behaviors that could be improved.    Nutrition Goals Re-Evaluation:   Nutrition Goals Discharge (Final Nutrition Goals Re-Evaluation):   Psychosocial: Target Goals: Acknowledge presence or absence of significant depression and/or stress, maximize coping skills, provide positive support system. Participant is able to verbalize types and ability to use techniques and skills needed for reducing stress and depression.   Education: Stress, Anxiety, and Depression - Group verbal and visual presentation to define topics covered.  Reviews how body is impacted by stress, anxiety, and depression.  Also discusses healthy ways to reduce stress and to treat/manage anxiety and depression.  Written material given at graduation.   Education: Sleep Hygiene -Provides group verbal and written instruction about how sleep can affect your health.  Define sleep hygiene, discuss sleep cycles and impact of sleep habits. Review good sleep hygiene tips.    Initial Review & Psychosocial Screening:  Initial Psych Review & Screening - 02/03/23 1341       Initial Review   Current issues with Current Stress Concerns    Comments healing from MI  Family Dynamics   Good Support System? Yes   family     Barriers   Psychosocial barriers to participate in program There are no identifiable barriers or psychosocial needs.;The patient should benefit from training in stress management and relaxation.      Screening Interventions   Interventions Encouraged to exercise;Provide feedback about the scores to participant;To provide support and resources with identified psychosocial needs    Expected Outcomes Short Term goal: Utilizing psychosocial counselor, staff and physician to assist with identification of specific  Stressors or current issues interfering with healing process. Setting desired goal for each stressor or current issue identified.;Long Term Goal: Stressors or current issues are controlled or eliminated.;Short Term goal: Identification and review with participant of any Quality of Life or Depression concerns found by scoring the questionnaire.;Long Term goal: The participant improves quality of Life and PHQ9 Scores as seen by post scores and/or verbalization of changes             Quality of Life Scores:   Quality of Life - 02/07/23 1642       Quality of Life   Select Quality of Life      Quality of Life Scores   Health/Function Pre 27.83 %    Socioeconomic Pre 28.13 %    Psych/Spiritual Pre 27.21 %    Family Pre 27 %    GLOBAL Pre 27.66 %            Scores of 19 and below usually indicate a poorer quality of life in these areas.  A difference of  2-3 points is a clinically meaningful difference.  A difference of 2-3 points in the total score of the Quality of Life Index has been associated with significant improvement in overall quality of life, self-image, physical symptoms, and general health in studies assessing change in quality of life.  PHQ-9: Review Flowsheet       02/07/2023 02/02/2023 06/24/2020  Depression screen PHQ 2/9  Decreased Interest 0 0 0  Down, Depressed, Hopeless 0 0 0  PHQ - 2 Score 0 0 0  Altered sleeping 0 - -  Tired, decreased energy 0 - -  Change in appetite 0 - -  Feeling bad or failure about yourself  0 - -  Trouble concentrating 0 - -  Moving slowly or fidgety/restless 0 - -  Suicidal thoughts 0 - -  PHQ-9 Score 0 - -    Details           Interpretation of Total Score  Total Score Depression Severity:  1-4 = Minimal depression, 5-9 = Mild depression, 10-14 = Moderate depression, 15-19 = Moderately severe depression, 20-27 = Severe depression   Psychosocial Evaluation and Intervention:  Psychosocial Evaluation - 02/03/23 1349        Psychosocial Evaluation & Interventions   Comments Mr. Menken is coming to cardiac rehab after a type 2 MI/ SCAD. This event came as a surprise to him and has been stressful learning how to recover from. He lives a very active lifestyle, including exercising regularly, working on cars, water sports, and yard work. He plans on returning to work next week by easing into working from home some and then the office more. He has a very supportive family. He wants to work on his stamina and education related to living  heart healthy lifestyle. He is ready to get back to his usual exercise routine and is grateful for the heart monitoring in the program.    Expected  Outcomes Short: attend cardiac rehab for education and exercise. Long; Develop and maitnain positive self care habits.    Continue Psychosocial Services  Follow up required by staff             Psychosocial Re-Evaluation:   Psychosocial Discharge (Final Psychosocial Re-Evaluation):   Vocational Rehabilitation: Provide vocational rehab assistance to qualifying candidates.   Vocational Rehab Evaluation & Intervention:  Vocational Rehab - 02/07/23 1644       Initial Vocational Rehab Evaluation & Intervention   Assessment shows need for Vocational Rehabilitation No             Education: Education Goals: Education classes will be provided on a variety of topics geared toward better understanding of heart health and risk factor modification. Participant will state understanding/return demonstration of topics presented as noted by education test scores.  Learning Barriers/Preferences:  Learning Barriers/Preferences - 02/03/23 1340       Learning Barriers/Preferences   Learning Barriers None    Learning Preferences None             General Cardiac Education Topics:  AED/CPR: - Group verbal and written instruction with the use of models to demonstrate the basic use of the AED with the basic ABC's of  resuscitation.   Anatomy and Cardiac Procedures: - Group verbal and visual presentation and models provide information about basic cardiac anatomy and function. Reviews the testing methods done to diagnose heart disease and the outcomes of the test results. Describes the treatment choices: Medical Management, Angioplasty, or Coronary Bypass Surgery for treating various heart conditions including Myocardial Infarction, Angina, Valve Disease, and Cardiac Arrhythmias.  Written material given at graduation. Flowsheet Row Cardiac Rehab from 03/17/2023 in [facility] Cardiac and Pulmonary Rehab  Education need identified 02/07/23       Medication Safety: - Group verbal and visual instruction to review commonly prescribed medications for heart and lung disease. Reviews the medication, class of the drug, and side effects. Includes the steps to properly store meds and maintain the prescription regimen.  Written material given at graduation.   Intimacy: - Group verbal instruction through game format to discuss how heart and lung disease can affect sexual intimacy. Written material given at graduation..   Know Your Numbers and Heart Failure: - Group verbal and visual instruction to discuss disease risk factors for cardiac and pulmonary disease and treatment options.  Reviews associated critical values for Overweight/Obesity, Hypertension, Cholesterol, and Diabetes.  Discusses basics of heart failure: signs/symptoms and treatments.  Introduces Heart Failure Zone chart for action plan for heart failure.  Written material given at graduation.   Infection Prevention: - Provides verbal and written material to individual with discussion of infection control including proper hand washing and proper equipment cleaning during exercise session. Flowsheet Row Cardiac Rehab from 03/17/2023 in [facility] Cardiac and Pulmonary Rehab  Date 02/07/23  Educator MB  Instruction Review Code 1- Verbalizes Understanding        Falls Prevention: - Provides verbal and written material to individual with discussion of falls prevention and safety. Flowsheet Row Cardiac Rehab from 03/17/2023 in [facility] Cardiac and Pulmonary Rehab  Date 02/07/23  Educator MB  Instruction Review Code 1- Verbalizes Understanding       Other: -Provides group and verbal instruction on various topics (see comments)   Knowledge Questionnaire Score:  Knowledge Questionnaire Score - 02/07/23 1644       Knowledge Questionnaire Score   Pre Score 25/26  Core Components/Risk Factors/Patient Goals at Admission:  Personal Goals and Risk Factors at Admission - 02/07/23 1645       Core Components/Risk Factors/Patient Goals on Admission    Weight Management Yes;Weight Loss    Intervention Weight Management: Develop a combined nutrition and exercise program designed to reach desired caloric intake, while maintaining appropriate intake of nutrient and fiber, sodium and fats, and appropriate energy expenditure required for the weight goal.;Weight Management: Provide education and appropriate resources to help participant work on and attain dietary goals.;Weight Management/Obesity: Establish reasonable short term and long term weight goals.    Admit Weight 187 lb 9.6 oz (85.1 kg)    Goal Weight: Short Term 182 lb 9.6 oz (82.8 kg)    Goal Weight: Long Term 177 lb 9.6 oz (80.6 kg)    Expected Outcomes Short Term: Continue to assess and modify interventions until short term weight is achieved;Long Term: Adherence to nutrition and physical activity/exercise program aimed toward attainment of established weight goal;Weight Loss: Understanding of general recommendations for a balanced deficit meal plan, which promotes 1-2 lb weight loss per week and includes a negative energy balance of [phone] kcal/d;Understanding recommendations for meals to include 15-35% energy as protein, 25-35% energy from fat, 35-60% energy from carbohydrates,  less than 200mg  of dietary cholesterol, 20-35 gm of total fiber daily;Understanding of distribution of calorie intake throughout the day with the consumption of 4-5 meals/snacks    Hypertension Yes    Intervention Monitor prescription use compliance.;Provide education on lifestyle modifcations including regular physical activity/exercise, weight management, moderate sodium restriction and increased consumption of fresh fruit, vegetables, and low fat dairy, alcohol moderation, and smoking cessation.    Expected Outcomes Short Term: Continued assessment and intervention until BP is < 140/13mm HG in hypertensive participants. < 130/56mm HG in hypertensive participants with diabetes, heart failure or chronic kidney disease.;Long Term: Maintenance of blood pressure at goal levels.    Lipids Yes    Intervention Provide education and support for participant on nutrition & aerobic/resistive exercise along with prescribed medications to achieve LDL 70mg , HDL >40mg .    Expected Outcomes Short Term: Participant states understanding of desired cholesterol values and is compliant with medications prescribed. Participant is following exercise prescription and nutrition guidelines.;Long Term: Cholesterol controlled with medications as prescribed, with individualized exercise RX and with personalized nutrition plan. Value goals: LDL < 70mg , HDL > 40 mg.             Education:Diabetes - Individual verbal and written instruction to review signs/symptoms of diabetes, desired ranges of glucose level fasting, after meals and with exercise. Acknowledge that pre and post exercise glucose checks will be done for 3 sessions at entry of program.   Core Components/Risk Factors/Patient Goals Review:    Core Components/Risk Factors/Patient Goals at Discharge (Final Review):    ITP Comments:  ITP Comments     Row Name 02/03/23 1339 02/07/23 1633 02/15/23 1112 02/23/23 0959 03/23/23 0840   ITP Comments Initial phone  call completed. Diagnosis can be found in [facility] 9/1. EP Orientation scheduled for Monday, 9/16 at 2:30 pm. Completed and gym orientation. Initial ITP created and sent for review to Dr. Bethann Punches, Medical Director. First full day of exercise!  Patient was oriented to gym and equipment including functions, settings, policies, and procedures.  Patient's individual exercise prescription and treatment plan were reviewed.  All starting workloads were established based on the results of the 6 minute walk test done at initial orientation visit.  The  plan for exercise progression was also introduced and progression will be customized based on patient's performance and goals. 30 Day review completed. Medical Director ITP review done, changes made as directed, and signed approval by Medical Director.   new to program 30 Day review completed. Medical Director ITP review done, changes made as directed, and signed approval by Medical Director.            Comments:

## 2023-03-24 ENCOUNTER — Encounter: Payer: BC Managed Care – PPO | Admitting: *Deleted

## 2023-03-24 DIAGNOSIS — Z48812 Encounter for surgical aftercare following surgery on the circulatory system: Secondary | ICD-10-CM | POA: Diagnosis not present

## 2023-03-24 DIAGNOSIS — I21A1 Myocardial infarction type 2: Secondary | ICD-10-CM | POA: Diagnosis not present

## 2023-03-24 NOTE — Progress Notes (Signed)
Daily Session Note  Patient Details  Name: Nicholas Arnold MRN: [id number] Date of Birth: Mar 04, 1966 Referring Provider:   Flowsheet Row Cardiac Rehab from 02/07/2023 in [facility] Cardiac and Pulmonary Rehab  Referring Provider Julien Nordmann       Encounter Date: 03/24/2023  Check In:  Session Check In - 03/24/23 0924       Check-In   Supervising physician immediately available to respond to emergencies See telemetry face sheet for immediately available ER MD    Location [facility]-Cardiac & Pulmonary Rehab    Staff Present Cora Collum, RN, BSN, CCRP;Joseph Hood, RCP,RRT,BSRT;Maxon [place] BS, , Exercise Physiologist;Humbert Morozov Katrinka Blazing, RN, [place]    Virtual Visit No    Medication changes reported     No    Fall or balance concerns reported    No    Warm-up and Cool-down Performed on first and last piece of equipment    Resistance Training Performed Yes    VAD Patient? No    PAD/SET Patient? No      Pain Assessment   Currently in Pain? No/denies                Social History   Tobacco Use  Smoking Status Never  Smokeless Tobacco Never    Goals Met:  Independence with exercise equipment Exercise tolerated well No report of concerns or symptoms today Strength training completed today  Goals Unmet:  Not Applicable  Comments: Pt able to follow exercise prescription today without complaint.  Will continue to monitor for progression.    Dr. Bethann Punches is Medical Director for [facility] Cardiac Rehabilitation.  Dr. Vida Rigger is Medical Director for [facility] Pulmonary Rehabilitation.

## 2023-03-25 ENCOUNTER — Other Ambulatory Visit: Payer: Self-pay | Admitting: Primary Care

## 2023-03-25 DIAGNOSIS — E785 Hyperlipidemia, unspecified: Secondary | ICD-10-CM

## 2023-03-29 ENCOUNTER — Encounter: Payer: BC Managed Care – PPO | Attending: Cardiovascular Disease | Admitting: *Deleted

## 2023-03-29 DIAGNOSIS — I252 Old myocardial infarction: Secondary | ICD-10-CM | POA: Insufficient documentation

## 2023-03-29 DIAGNOSIS — I21A1 Myocardial infarction type 2: Secondary | ICD-10-CM

## 2023-03-29 NOTE — Progress Notes (Signed)
Daily Session Note  Patient Details  Name: Nicholas Arnold MRN: [id number] Date of Birth: 1965-12-07 Referring Provider:   Flowsheet Row Cardiac Rehab from 02/07/2023 in [facility] Cardiac and Pulmonary Rehab  Referring Provider Julien Nordmann       Encounter Date: 03/29/2023  Check In:  Session Check In - 03/29/23 0943       Check-In   Supervising physician immediately available to respond to emergencies See telemetry face sheet for immediately available ER MD    Location [facility]-Cardiac & Pulmonary Rehab    Staff Present Cora Collum, RN, BSN, CCRP;Margaret Best, MS, Exercise Physiologist;Noah Tickle, BS, Exercise Physiologist;Maxon Conetta BS, , Exercise Physiologist    Virtual Visit No    Medication changes reported     No    Fall or balance concerns reported    No    Warm-up and Cool-down Performed on first and last piece of equipment    Resistance Training Performed Yes    VAD Patient? No    PAD/SET Patient? No      Pain Assessment   Currently in Pain? No/denies                Social History   Tobacco Use  Smoking Status Never  Smokeless Tobacco Never    Goals Met:  Independence with exercise equipment Exercise tolerated well No report of concerns or symptoms today  Goals Unmet:  Not Applicable  Comments: Pt able to follow exercise prescription today without complaint.  Will continue to monitor for progression.    Dr. Bethann Punches is Medical Director for [facility] Cardiac Rehabilitation.  Dr. Vida Rigger is Medical Director for [facility] Pulmonary Rehabilitation.

## 2023-03-31 ENCOUNTER — Encounter: Payer: BC Managed Care – PPO | Admitting: *Deleted

## 2023-03-31 DIAGNOSIS — I252 Old myocardial infarction: Secondary | ICD-10-CM | POA: Diagnosis not present

## 2023-03-31 DIAGNOSIS — I21A1 Myocardial infarction type 2: Secondary | ICD-10-CM

## 2023-03-31 NOTE — Progress Notes (Signed)
Daily Session Note  Patient Details  Name: Nicholas Arnold MRN: [id number] Date of Birth: 06/24/1965 Referring Provider:   Flowsheet Row Cardiac Rehab from 02/07/2023 in [facility] Cardiac and Pulmonary Rehab  Referring Provider Julien Nordmann       Encounter Date: 03/31/2023  Check In:  Session Check In - 03/31/23 0931       Check-In   Supervising physician immediately available to respond to emergencies See telemetry face sheet for immediately available ER MD    Location [facility]-Cardiac & Pulmonary Rehab    Staff Present Ronette Deter, BS, Exercise Physiologist;Susanne Bice, RN, BSN, CCRP;Eddie Koc Katrinka Blazing, RN, ADN;Margaret Best, MS, Exercise Physiologist    Virtual Visit No    Medication changes reported     No    Fall or balance concerns reported    No    Warm-up and Cool-down Performed on first and last piece of equipment    Resistance Training Performed Yes    VAD Patient? No    PAD/SET Patient? No      Pain Assessment   Currently in Pain? No/denies                Social History   Tobacco Use  Smoking Status Never  Smokeless Tobacco Never    Goals Met:  Independence with exercise equipment Exercise tolerated well No report of concerns or symptoms today Strength training completed today  Goals Unmet:  Not Applicable  Comments: Pt able to follow exercise prescription today without complaint.  Will continue to monitor for progression.    Dr. Bethann Punches is Medical Director for [facility] Cardiac Rehabilitation.  Dr. Vida Rigger is Medical Director for [facility] Pulmonary Rehabilitation.

## 2023-04-05 ENCOUNTER — Other Ambulatory Visit (INDEPENDENT_AMBULATORY_CARE_PROVIDER_SITE_OTHER): Payer: BC Managed Care – PPO

## 2023-04-05 DIAGNOSIS — I252 Old myocardial infarction: Secondary | ICD-10-CM | POA: Diagnosis not present

## 2023-04-05 DIAGNOSIS — I21A1 Myocardial infarction type 2: Secondary | ICD-10-CM

## 2023-04-05 DIAGNOSIS — I455 Other specified heart block: Secondary | ICD-10-CM | POA: Diagnosis not present

## 2023-04-05 DIAGNOSIS — E785 Hyperlipidemia, unspecified: Secondary | ICD-10-CM | POA: Diagnosis not present

## 2023-04-05 LAB — HEPATIC FUNCTION PANEL
ALT: 76 U/L — ABNORMAL HIGH (ref 0–53)
AST: 37 U/L (ref 0–37)
Albumin: 4.3 g/dL (ref 3.5–5.2)
Alkaline Phosphatase: 61 U/L (ref 39–117)
Bilirubin, Direct: 0.1 mg/dL (ref 0.0–0.3)
Total Bilirubin: 0.6 mg/dL (ref 0.2–1.2)
Total Protein: 6.2 g/dL (ref 6.0–8.3)

## 2023-04-05 LAB — LIPID PANEL
Cholesterol: 115 mg/dL (ref 0–200)
HDL: 38.3 mg/dL — ABNORMAL LOW (ref >39.00)
LDL Cholesterol: 63 mg/dL (ref 0–99)
NonHDL: 76.27
Total CHOL/HDL Ratio: 3
Triglycerides: 64 mg/dL (ref 0.0–149.0)
VLDL: 12.8 mg/dL (ref 0.0–40.0)

## 2023-04-05 NOTE — Progress Notes (Signed)
Daily Session Note  Patient Details  Name: Nicholas Arnold MRN: [id number] Date of Birth: [birth year] Referring Provider:   Flowsheet Row Cardiac Rehab from 02/07/2023 in [facility] Cardiac and Pulmonary Rehab  Referring Provider Julien Nordmann       Encounter Date: 04/05/2023  Check In:  Session Check In - 04/05/23 0926       Check-In   Supervising physician immediately available to respond to emergencies See telemetry face sheet for immediately available ER MD    Staff Present Maxon Conetta BS, , Exercise Physiologist;Lotus Gover, RN, BSN;Margaret Best, MS, Exercise Physiologist;Noah Tickle, BS, Exercise Physiologist    Virtual Visit No    Medication changes reported     No    Fall or balance concerns reported    No    Warm-up and Cool-down Performed on first and last piece of equipment    Resistance Training Performed Yes    VAD Patient? No    PAD/SET Patient? No      Pain Assessment   Currently in Pain? No/denies                Social History   Tobacco Use  Smoking Status Never  Smokeless Tobacco Never    Goals Met:  Independence with exercise equipment Exercise tolerated well No report of concerns or symptoms today Strength training completed today  Goals Unmet:  Not Applicable  Comments: Pt able to follow exercise prescription today without complaint.  Will continue to monitor for progression.   Dr. Bethann Punches is Medical Director for [facility] Cardiac Rehabilitation.  Dr. Vida Rigger is Medical Director for [facility] Pulmonary Rehabilitation.

## 2023-04-07 ENCOUNTER — Encounter: Payer: BC Managed Care – PPO | Admitting: *Deleted

## 2023-04-07 ENCOUNTER — Ambulatory Visit: Payer: BC Managed Care – PPO | Admitting: Internal Medicine

## 2023-04-07 DIAGNOSIS — I252 Old myocardial infarction: Secondary | ICD-10-CM | POA: Diagnosis not present

## 2023-04-07 DIAGNOSIS — I21A1 Myocardial infarction type 2: Secondary | ICD-10-CM

## 2023-04-07 NOTE — Progress Notes (Signed)
Daily Session Note  Patient Details  Name: Nicholas Arnold MRN: [id number] Date of Birth: 1965/05/27 Referring Provider:   Flowsheet Row Cardiac Rehab from 02/07/2023 in [facility] Cardiac and Pulmonary Rehab  Referring Provider Julien Nordmann       Encounter Date: 04/07/2023  Check In:  Session Check In - 04/07/23 0900       Check-In   Supervising physician immediately available to respond to emergencies See telemetry face sheet for immediately available ER MD    Location [facility]-Cardiac & Pulmonary Rehab    Staff Present Cora Collum, RN, BSN, CCRP;Taiyo Kozma Katrinka Blazing, RN, ADN;Margaret Best, MS, Exercise Physiologist    Virtual Visit No    Medication changes reported     No    Fall or balance concerns reported    No    Warm-up and Cool-down Performed on first and last piece of equipment    Resistance Training Performed Yes    VAD Patient? No    PAD/SET Patient? No      Pain Assessment   Currently in Pain? No/denies                Social History   Tobacco Use  Smoking Status Never  Smokeless Tobacco Never    Goals Met:  Independence with exercise equipment Exercise tolerated well No report of concerns or symptoms today Strength training completed today  Goals Unmet:  Not Applicable  Comments: Pt able to follow exercise prescription today without complaint.  Will continue to monitor for progression.    Dr. Bethann Punches is Medical Director for [facility] Cardiac Rehabilitation.  Dr. Vida Rigger is Medical Director for [facility] Pulmonary Rehabilitation.

## 2023-04-12 ENCOUNTER — Encounter: Payer: BC Managed Care – PPO | Admitting: *Deleted

## 2023-04-12 DIAGNOSIS — I21A1 Myocardial infarction type 2: Secondary | ICD-10-CM

## 2023-04-12 DIAGNOSIS — I252 Old myocardial infarction: Secondary | ICD-10-CM | POA: Diagnosis not present

## 2023-04-12 NOTE — Progress Notes (Signed)
Daily Session Note  Patient Details  Name: Nicholas Arnold MRN: [id number] Date of Birth: 07-26-1965 Referring Provider:   Flowsheet Row Cardiac Rehab from 02/07/2023 in [facility] Cardiac and Pulmonary Rehab  Referring Provider Julien Nordmann       Encounter Date: 04/12/2023  Check In:  Session Check In - 04/12/23 0930       Check-In   Supervising physician immediately available to respond to emergencies See telemetry face sheet for immediately available ER MD    Location [facility]-Cardiac & Pulmonary Rehab    Staff Present Cora Collum, RN, BSN, CCRP;Margaret Best, MS, Exercise Physiologist;Noah Tickle, BS, Exercise Physiologist;Maxon Conetta BS, , Exercise Physiologist    Virtual Visit No    Medication changes reported     No    Fall or balance concerns reported    No    Warm-up and Cool-down Performed on first and last piece of equipment    Resistance Training Performed Yes    VAD Patient? No    PAD/SET Patient? No      Pain Assessment   Currently in Pain? No/denies                Social History   Tobacco Use  Smoking Status Never  Smokeless Tobacco Never    Goals Met:  Independence with exercise equipment Exercise tolerated well No report of concerns or symptoms today  Goals Unmet:  Not Applicable  Comments: Pt able to follow exercise prescription today without complaint.  Will continue to monitor for progression.    Dr. Bethann Punches is Medical Director for [facility] Cardiac Rehabilitation.  Dr. Vida Rigger is Medical Director for [facility] Pulmonary Rehabilitation.

## 2023-04-13 ENCOUNTER — Encounter: Payer: Self-pay | Admitting: *Deleted

## 2023-04-13 DIAGNOSIS — I21A1 Myocardial infarction type 2: Secondary | ICD-10-CM

## 2023-04-13 NOTE — Progress Notes (Signed)
Cardiac Individual Treatment Plan  Patient Details  Name: SHOLEM VENHUIZEN MRN: [id number] Date of Birth: April 24, 1966 Referring Provider:   Flowsheet Row Cardiac Rehab from 02/07/2023 in [facility] Cardiac and Pulmonary Rehab  Referring Provider Julien Nordmann       Initial Encounter Date:  Flowsheet Row Cardiac Rehab from 02/07/2023 in [facility] Cardiac and Pulmonary Rehab  Date 02/07/23       Visit Diagnosis: Type 2 MI (myocardial infarction) (HCC)  Patient's Home Medications on Admission:  Current Outpatient Medications:    ascorbic acid (VITAMIN C) 250 MG CHEW, Chew 2 tablets by mouth daily., Disp: , Rfl:    aspirin EC 81 MG tablet, Take 81 mg by mouth daily. Swallow whole., Disp: , Rfl:    atorvastatin (LIPITOR) 80 MG tablet, Take 1 tablet (80 mg total) by mouth daily., Disp: 90 tablet, Rfl: 3   Cholecalciferol 10 MCG (400 UNIT) CHEW, Chew 2 tablets by mouth daily., Disp: , Rfl:    metoprolol succinate (TOPROL-XL) 25 MG 24 hr tablet, Take 4 tablets (100 mg total) by mouth daily. Take with or immediately following a meal., Disp: 90 tablet, Rfl: 3  Past Medical History: Past Medical History:  Diagnosis Date   Chickenpox    GERD (gastroesophageal reflux disease)    Hyperlipemia    Neck mass    removed in 2012, benign   Prediabetes    Pyloric stenosis     Tobacco Use: Social History   Tobacco Use  Smoking Status Never  Smokeless Tobacco Never    Labs: Review Flowsheet  More data exists      Latest Ref Rng & Units 06/13/2015 08/13/2016 02/08/2017 06/24/2020 04/05/2023  Labs for ITP Cardiac and Pulmonary Rehab  Cholestrol 0 - 200 mg/dL 191  478  295  621  308   LDL (calc) 0 - 99 mg/dL 657  846  962  952  63   HDL-C >39.00 mg/dL 84.13  24.40  10.27  25.36  38.30   Trlycerides 0.0 - 149.0 mg/dL 64.4  034.7  425.9  56.3  64.0   Hemoglobin A1c 4.6 - 6.5 % - 5.8  5.6  5.7  -    Details             Exercise Target Goals: Exercise Program Goal: Individual exercise  prescription set using results from initial 6 min walk test and THRR while considering  patient's activity barriers and safety.   Exercise Prescription Goal: Initial exercise prescription builds to 30-45 minutes a day of aerobic activity, 2-3 days per week.  Home exercise guidelines will be given to patient during program as part of exercise prescription that the participant will acknowledge.   Education: Aerobic Exercise: - Group verbal and visual presentation on the components of exercise prescription. Introduces F.I.T.T principle from ACSM for exercise prescriptions.  Reviews F.I.T.T. principles of aerobic exercise including progression. Written material given at graduation. Flowsheet Row Cardiac Rehab from 04/07/2023 in [facility] Cardiac and Pulmonary Rehab  Date 03/24/23  Educator MB  Instruction Review Code 1- Verbalizes Understanding       Education: Resistance Exercise: - Group verbal and visual presentation on the components of exercise prescription. Introduces F.I.T.T principle from ACSM for exercise prescriptions  Reviews F.I.T.T. principles of resistance exercise including progression. Written material given at graduation. Flowsheet Row Cardiac Rehab from 04/07/2023 in [facility] Cardiac and Pulmonary Rehab  Date 03/17/23  Educator MB  Instruction Review Code 1- [employer] Understanding        Education: Exercise &  Equipment Safety: - Individual verbal instruction and demonstration of equipment use and safety with use of the equipment. Flowsheet Row Cardiac Rehab from 04/07/2023 in [facility] Cardiac and Pulmonary Rehab  Date 02/07/23  Educator MB  Instruction Review Code 1- Verbalizes Understanding       Education: Exercise Physiology & General Exercise Guidelines: - Group verbal and written instruction with models to review the exercise physiology of the cardiovascular system and associated critical values. Provides general exercise guidelines with specific guidelines to those with  heart or lung disease.  Flowsheet Row Cardiac Rehab from 04/07/2023 in [facility] Cardiac and Pulmonary Rehab  Date 03/10/23  Educator MB  Instruction Review Code 1- [employer] Understanding       Education: Flexibility, Balance, Mind/Body Relaxation: - Group verbal and visual presentation with interactive activity on the components of exercise prescription. Introduces F.I.T.T principle from ACSM for exercise prescriptions. Reviews F.I.T.T. principles of flexibility and balance exercise training including progression. Also discusses the mind body connection.  Reviews various relaxation techniques to help reduce and manage stress (i.e. Deep breathing, progressive muscle relaxation, and visualization). Balance handout provided to take home. Written material given at graduation. Flowsheet Row Cardiac Rehab from 04/07/2023 in [facility] Cardiac and Pulmonary Rehab  Date 03/17/23  Educator MB  Instruction Review Code 1- Verbalizes Understanding       Activity Barriers & Risk Stratification:  Activity Barriers & Cardiac Risk Stratification - 02/07/23 1635       Activity Barriers & Cardiac Risk Stratification   Activity Barriers None    Cardiac Risk Stratification Moderate             6 Minute Walk:  6 Minute Walk     Row Name 02/07/23 1633         6 Minute Walk   Phase Initial     Distance 1890 feet     Walk Time 6 minutes     # of Rest Breaks 0     MPH 3.58     METS 4.97     RPE 7     Perceived Dyspnea  0     VO2 Peak 17.39     Symptoms No     Resting HR 67 bpm     Resting BP 92/62     Resting Oxygen Saturation  98 %     Exercise Oxygen Saturation  during 6 min walk 99 %     Max Ex. HR 127 bpm     Max Ex. BP 126/70     2 Minute Post BP 112/74              Oxygen Initial Assessment:   Oxygen Re-Evaluation:   Oxygen Discharge (Final Oxygen Re-Evaluation):   Initial Exercise Prescription:  Initial Exercise Prescription - 02/07/23 1600       Date of Initial  Exercise RX and Referring Provider   Date 02/07/23    Referring Provider Julien Nordmann      Oxygen   Maintain Oxygen Saturation 88% or higher      Treadmill   MPH 3.8    Grade 0    Minutes 15    METs 3.91      Elliptical   Level 2    Speed 3    Minutes 15    METs 4.97      T5 Nustep   Level 4    SPM 80    Minutes 15    METs 4.97      Prescription  Details   Frequency (times per week) 2    Duration Progress to 30 minutes of continuous aerobic without signs/symptoms of physical distress      Intensity   THRR 40-80% of Max Heartrate 106-146    Ratings of Perceived Exertion 11-13    Perceived Dyspnea 0-4      Progression   Progression Continue to progress workloads to maintain intensity without signs/symptoms of physical distress.      Resistance Training   Training Prescription Yes    Weight 10 lb    Reps 10-15             Perform Capillary Blood Glucose checks as needed.  Exercise Prescription Changes:   Exercise Prescription Changes     Row Name 02/07/23 1600 02/24/23 1700 03/10/23 1400 03/24/23 0800 03/29/23 1000     Response to Exercise   Blood Pressure (Admit) 92/62 110/68 118/78 104/70 --   Blood Pressure (Exercise) 126/70 132/68 160/70 148/74 --   Blood Pressure (Exit) 112/74 114/64 124/72 104/54 --   Heart Rate (Admit) 67 bpm 65 bpm 66 bpm 68 bpm --   Heart Rate (Exercise) 127 bpm 132 bpm 149 bpm 138 bpm --   Heart Rate (Exit) 68 bpm 86 bpm 81 bpm 95 bpm --   Oxygen Saturation (Admit) 98 % -- -- -- --   Oxygen Saturation (Exercise) 99 % -- -- -- --   Oxygen Saturation (Exit) 99 % -- -- -- --   Rating of Perceived Exertion (Exercise) 7 12 13 13  --   Perceived Dyspnea (Exercise) 0 0 0 0 --   Symptoms none none none none --   Comments results -- -- -- --   Duration Progress to 30 minutes of  aerobic without signs/symptoms of physical distress Progress to 30 minutes of  aerobic without signs/symptoms of physical distress Progress to 30  minutes of  aerobic without signs/symptoms of physical distress Progress to 30 minutes of  aerobic without signs/symptoms of physical distress --   Intensity THRR New THRR unchanged THRR unchanged THRR unchanged --     Progression   Progression Continue to progress workloads to maintain intensity without signs/symptoms of physical distress. Continue to progress workloads to maintain intensity without signs/symptoms of physical distress. Continue to progress workloads to maintain intensity without signs/symptoms of physical distress. Continue to progress workloads to maintain intensity without signs/symptoms of physical distress. --   Average METs 4.97 3.3 4.6 5.62 --     Resistance Training   Training Prescription -- Yes Yes Yes --   Weight -- 10 lb 10 lb 10 lb --   Reps -- 10-15 10-15 10-15 --     Interval Training   Interval Training -- No No No --     Oxygen   Oxygen -- Continuous -- -- --     Treadmill   MPH -- 3.9 4 4  --   Grade -- 0 2 3 --   Minutes -- 15 15 15  --   METs -- 3.99 5.17 5.72 --     Elliptical   Level -- 4 5 5  --   Speed -- 3.7 4.5 6.2 --   Minutes -- 15 15 15  --   METs -- -- -- 5.5 --     T5 Nustep   Level -- 4 4 -- --   Minutes -- 15 15 -- --   METs -- 2.3 3.9 -- --     Home Exercise Plan   Plans to continue  exercise at -- -- -- -- Home (comment)  Treadmill at home   Frequency -- -- -- -- Add 2 additional days to program exercise sessions.   Initial Home Exercises Provided -- -- -- -- 03/29/23     Oxygen   Maintain Oxygen Saturation -- 88% or higher 88% or higher 88% or higher --    Row Name 04/06/23 1500             Response to Exercise   Blood Pressure (Admit) 98/62       Blood Pressure (Exercise) 144/58       Blood Pressure (Exit) 102/58       Heart Rate (Admit) 70 bpm       Heart Rate (Exercise) 133 bpm       Heart Rate (Exit) 89 bpm       Rating of Perceived Exertion (Exercise) 14       Symptoms none       Duration Continue with 30  min of aerobic exercise without signs/symptoms of physical distress.       Intensity THRR unchanged         Progression   Progression Continue to progress workloads to maintain intensity without signs/symptoms of physical distress.       Average METs 6.18         Resistance Training   Training Prescription Yes       Weight 10 lb       Reps 10-15         Interval Training   Interval Training No         Treadmill   MPH 4       Grade 4       Minutes 15       METs 6.27         Elliptical   Level 6       Speed 6.2       Minutes 15       METs 6.27         Home Exercise Plan   Plans to continue exercise at Home (comment)  Treadmill at home       Frequency Add 2 additional days to program exercise sessions.       Initial Home Exercises Provided 03/29/23         Oxygen   Maintain Oxygen Saturation 88% or higher                Exercise Comments:   Exercise Comments     Row Name 02/15/23 1112           Exercise Comments First full day of exercise!  Patient was oriented to gym and equipment including functions, settings, policies, and procedures.  Patient's individual exercise prescription and treatment plan were reviewed.  All starting workloads were established based on the results of the 6 minute walk test done at initial orientation visit.  The plan for exercise progression was also introduced and progression will be customized based on patient's performance and goals.                Exercise Goals and Review:   Exercise Goals     Row Name 02/07/23 1640             Exercise Goals   Increase Physical Activity Yes       Intervention Provide advice, education, support and counseling about physical activity/exercise needs.;Develop an individualized exercise prescription for aerobic and resistive training based on  initial evaluation findings, risk stratification, comorbidities and participant's personal goals.       Expected Outcomes Short Term: Attend rehab  on a regular basis to increase amount of physical activity.;Long Term: Exercising regularly at least 3-5 days a week.;Long Term: Add in home exercise to make exercise part of routine and to increase amount of physical activity.       Increase Strength and Stamina Yes       Intervention Provide advice, education, support and counseling about physical activity/exercise needs.;Develop an individualized exercise prescription for aerobic and resistive training based on initial evaluation findings, risk stratification, comorbidities and participant's personal goals.       Expected Outcomes Short Term: Increase workloads from initial exercise prescription for resistance, speed, and METs.;Short Term: Perform resistance training exercises routinely during rehab and add in resistance training at home;Long Term: Improve cardiorespiratory fitness, muscular endurance and strength as measured by increased METs and functional capacity ( )       Able to understand and use rate of perceived exertion (RPE) scale Yes       Intervention Provide education and explanation on how to use RPE scale       Expected Outcomes Short Term: Able to use RPE daily in rehab to express subjective intensity level;Long Term:  Able to use RPE to guide intensity level when exercising independently       Able to understand and use Dyspnea scale Yes       Intervention Provide education and explanation on how to use Dyspnea scale       Expected Outcomes Short Term: Able to use Dyspnea scale daily in rehab to express subjective sense of shortness of breath during exertion;Long Term: Able to use Dyspnea scale to guide intensity level when exercising independently       Knowledge and understanding of Target Heart Rate Range (THRR) Yes       Intervention Provide education and explanation of THRR including how the numbers were predicted and where they are located for reference       Expected Outcomes Short Term: Able to state/look up THRR;Long  Term: Able to use THRR to govern intensity when exercising independently;Short Term: Able to use daily as guideline for intensity in rehab       Able to check pulse independently Yes       Intervention Provide education and demonstration on how to check pulse in carotid and radial arteries.;Review the importance of being able to check your own pulse for safety during independent exercise       Expected Outcomes Long Term: Able to check pulse independently and accurately;Short Term: Able to explain why pulse checking is important during independent exercise       Understanding of Exercise Prescription Yes       Intervention Provide education, explanation, and written materials on patient's individual exercise prescription       Expected Outcomes Short Term: Able to explain program exercise prescription;Long Term: Able to explain home exercise prescription to exercise independently                Exercise Goals Re-Evaluation :  Exercise Goals Re-Evaluation     Row Name 02/15/23 1112 02/24/23 1733 03/10/23 1427 03/24/23 0838 03/29/23 1004     Exercise Goal Re-Evaluation   Exercise Goals Review Able to understand and use rate of perceived exertion (RPE) scale;Able to understand and use Dyspnea scale;Knowledge and understanding of Target Heart Rate Range (THRR);Understanding of Exercise Prescription Increase Physical Activity;Increase Strength and  Stamina;Understanding of Exercise Prescription Increase Physical Activity;Increase Strength and Stamina;Understanding of Exercise Prescription Increase Physical Activity;Increase Strength and Stamina;Understanding of Exercise Prescription Increase Physical Activity;Able to understand and use Dyspnea scale;Understanding of Exercise Prescription;Increase Strength and Stamina;Knowledge and understanding of Target Heart Rate Range (THRR);Able to check pulse independently;Able to understand and use rate of perceived exertion (RPE) scale   Comments Reviewed RPE  and dyspnea scale, THR and program prescription with pt today.  Pt voiced understanding and was given a copy of goals to take home. Trey Paula is off to a great start in the program. He has attended his first 2 sessions during this review. He was able to increase his Treadmill speed from 3. to 3.64mph. We will continue to monitor his progress in the program. Trey Paula continues to do well in the program. He has increased his speed on the elliptical at level 1, from 3. to 4.32mph. He also increased his incline on the treadmill from 0% to 2%. We will continue to monitor his progress in the program. Trey Paula continues to do well in the program. He has recently been able to increase his incline on the treadmill from 2% to 3%. He has also increased his elliptical speed from 4.5 mph to 6.2 mph. We will continue to monitor his progress in the program. Reviewed home exercise with pt today.  Pt plans to add 2 additional days at home on his treadmill for exercise.  Reviewed THR, pulse, RPE, sign and symptoms, pulse oximetery and when to call 911 or MD.  Also discussed weather considerations and indoor options.  Pt voiced understanding.   Expected Outcomes Short: Use RPE daily to regulate intensity. Long: Follow program prescription in THR. Short: Continue to follow current exercise prescription, and progressively increase workloads. Long: Continue exercise to improve strength and stamina. Short: Continue to follow current exercise prescription, and progressively increase workloads. Long: Continue exercise to improve strength and stamina. Short: Continue to follow current exercise prescription, and progressively increase workloads. Long: Continue exercise to improve strength and stamina. Short: Add 2 additional days of exercise independently Long: Continue to exercise independently    Row Name 04/06/23 1543             Exercise Goal Re-Evaluation   Exercise Goals Review Increase Physical Activity;Increase Strength and  Stamina;Understanding of Exercise Prescription       Comments Trey Paula continues to do well in rehab. He increased his treadmill workload by improving his incline to 4% while maintaining a speed of 4 mph. He also improved to level 6 on the elliptical at a speed of 6.2 mph. He has stayed consistent with 10 lb hand weights for resistance training as well. We will continue to monitor his progress in the program.       Expected Outcomes Short: Try 12 lb hand weights for resistance training. Long: Continue exercise to improve strength and stamina.                Discharge Exercise Prescription (Final Exercise Prescription Changes):  Exercise Prescription Changes - 04/06/23 1500       Response to Exercise   Blood Pressure (Admit) 98/62    Blood Pressure (Exercise) 144/58    Blood Pressure (Exit) 102/58    Heart Rate (Admit) 70 bpm    Heart Rate (Exercise) 133 bpm    Heart Rate (Exit) 89 bpm    Rating of Perceived Exertion (Exercise) 14    Symptoms none    Duration Continue with 30 min of aerobic exercise  without signs/symptoms of physical distress.    Intensity THRR unchanged      Progression   Progression Continue to progress workloads to maintain intensity without signs/symptoms of physical distress.    Average METs 6.18      Resistance Training   Training Prescription Yes    Weight 10 lb    Reps 10-15      Interval Training   Interval Training No      Treadmill   MPH 4    Grade 4    Minutes 15    METs 6.27      Elliptical   Level 6    Speed 6.2    Minutes 15    METs 6.27      Home Exercise Plan   Plans to continue exercise at Home (comment)   Treadmill at home   Frequency Add 2 additional days to program exercise sessions.    Initial Home Exercises Provided 03/29/23      Oxygen   Maintain Oxygen Saturation 88% or higher             Nutrition:  Target Goals: Understanding of nutrition guidelines, daily intake of sodium 1500mg , cholesterol 200mg , calories 30%  from fat and 7% or less from saturated fats, daily to have 5 or more servings of fruits and vegetables.  Education: All About Nutrition: -Group instruction provided by verbal, written material, interactive activities, discussions, models, and posters to present general guidelines for heart healthy nutrition including fat, fiber, MyPlate, the role of sodium in heart healthy nutrition, utilization of the nutrition label, and utilization of this knowledge for meal planning. Follow up email sent as well. Written material given at graduation. Flowsheet Row Cardiac Rehab from 04/07/2023 in [facility] Cardiac and Pulmonary Rehab  Date 04/07/23  Educator JG  Instruction Review Code 1- Verbalizes Understanding       Biometrics:  Pre Biometrics - 02/07/23 1640       Pre Biometrics   Height 5\' 8"  (1.727 m)    Weight 187 lb 9.6 oz (85.1 kg)    Waist Circumference 39.5 inches    Hip Circumference 38.2 inches    Waist to Hip Ratio 1.03 %    BMI (Calculated) 28.53    Single Leg Stand 30 seconds              Nutrition Therapy Plan and Nutrition Goals:  Nutrition Therapy & Goals - 02/07/23 1643       Personal Nutrition Goals   Nutrition Goal Will meet with RD once work schedule is set after going back into the office      Intervention Plan   Intervention Prescribe, educate and counsel regarding individualized specific dietary modifications aiming towards targeted core components such as weight, hypertension, lipid management, diabetes, heart failure and other comorbidities.;Nutrition handout(s) given to patient.    Expected Outcomes Short Term Goal: Understand basic principles of dietary content, such as calories, fat, sodium, cholesterol and nutrients.;Long Term Goal: Adherence to prescribed nutrition plan.;Short Term Goal: A plan has been developed with personal nutrition goals set during dietitian appointment.             Nutrition Assessments:  MEDIFICTS Score Key: >=70 Need to make  dietary changes  40-70 Heart Healthy Diet <= 40 Therapeutic Level Cholesterol Diet  Flowsheet Row Cardiac Rehab from 02/07/2023 in [facility] Cardiac and Pulmonary Rehab  Picture Your Plate Total Score on Admission 59      Picture Your Plate Scores: <16 Unhealthy dietary pattern  with much room for improvement. 41-50 Dietary pattern unlikely to meet recommendations for good health and room for improvement. 51-60 More healthful dietary pattern, with some room for improvement.  >60 Healthy dietary pattern, although there may be some specific behaviors that could be improved.    Nutrition Goals Re-Evaluation:  Nutrition Goals Re-Evaluation     Row Name 04/12/23 0947             Goals   Nutrition Goal Trey Paula recently scheduled an appt to meet with RD. He states that he feels he is doing good with his nutrition as he has made some changes recently. He is looking forward to the RD appointment as he would like to learn more.       Comment Short: Meet with RD. Long: Practice heart healthy eating patterns discussed with RD.                Nutrition Goals Discharge (Final Nutrition Goals Re-Evaluation):  Nutrition Goals Re-Evaluation - 04/12/23 0947       Goals   Nutrition Goal Trey Paula recently scheduled an appt to meet with RD. He states that he feels he is doing good with his nutrition as he has made some changes recently. He is looking forward to the RD appointment as he would like to learn more.    Comment Short: Meet with RD. Long: Practice heart healthy eating patterns discussed with RD.             Psychosocial: Target Goals: Acknowledge presence or absence of significant depression and/or stress, maximize coping skills, provide positive support system. Participant is able to verbalize types and ability to use techniques and skills needed for reducing stress and depression.   Education: Stress, Anxiety, and Depression - Group verbal and visual presentation to define topics  covered.  Reviews how body is impacted by stress, anxiety, and depression.  Also discusses healthy ways to reduce stress and to treat/manage anxiety and depression.  Written material given at graduation.   Education: Sleep Hygiene -Provides group verbal and written instruction about how sleep can affect your health.  Define sleep hygiene, discuss sleep cycles and impact of sleep habits. Review good sleep hygiene tips.    Initial Review & Psychosocial Screening:  Initial Psych Review & Screening - 02/03/23 1341       Initial Review   Current issues with Current Stress Concerns    Comments healing from MI      Family Dynamics   Good Support System? Yes   family     Barriers   Psychosocial barriers to participate in program There are no identifiable barriers or psychosocial needs.;The patient should benefit from training in stress management and relaxation.      Screening Interventions   Interventions Encouraged to exercise;Provide feedback about the scores to participant;To provide support and resources with identified psychosocial needs    Expected Outcomes Short Term goal: Utilizing psychosocial counselor, staff and physician to assist with identification of specific Stressors or current issues interfering with healing process. Setting desired goal for each stressor or current issue identified.;Long Term Goal: Stressors or current issues are controlled or eliminated.;Short Term goal: Identification and review with participant of any Quality of Life or Depression concerns found by scoring the questionnaire.;Long Term goal: The participant improves quality of Life and PHQ9 Scores as seen by post scores and/or verbalization of changes             Quality of Life Scores:   Quality of Life -  02/07/23 1642       Quality of Life   Select Quality of Life      Quality of Life Scores   Health/Function Pre 27.83 %    Socioeconomic Pre 28.13 %    Psych/Spiritual Pre 27.21 %    Family  Pre 27 %    GLOBAL Pre 27.66 %            Scores of 19 and below usually indicate a poorer quality of life in these areas.  A difference of  2-3 points is a clinically meaningful difference.  A difference of 2-3 points in the total score of the Quality of Life Index has been associated with significant improvement in overall quality of life, self-image, physical symptoms, and general health in studies assessing change in quality of life.  PHQ-9: Review Flowsheet       02/07/2023 02/02/2023 06/24/2020  Depression screen PHQ 2/9  Decreased Interest 0 0 0  Down, Depressed, Hopeless 0 0 0  PHQ - 2 Score 0 0 0  Altered sleeping 0 - -  Tired, decreased energy 0 - -  Change in appetite 0 - -  Feeling bad or failure about yourself  0 - -  Trouble concentrating 0 - -  Moving slowly or fidgety/restless 0 - -  Suicidal thoughts 0 - -  PHQ-9 Score 0 - -    Details           Interpretation of Total Score  Total Score Depression Severity:  1-4 = Minimal depression, 5-9 = Mild depression, 10-14 = Moderate depression, 15-19 = Moderately severe depression, 20-27 = Severe depression   Psychosocial Evaluation and Intervention:  Psychosocial Evaluation - 02/03/23 1349       Psychosocial Evaluation & Interventions   Comments Mr. Holdeman is coming to cardiac rehab after a type 2 MI/ SCAD. This event came as a surprise to him and has been stressful learning how to recover from. He lives a very active lifestyle, including exercising regularly, working on cars, water sports, and yard work. He plans on returning to work next week by easing into working from home some and then the office more. He has a very supportive family. He wants to work on his stamina and education related to living  heart healthy lifestyle. He is ready to get back to his usual exercise routine and is grateful for the heart monitoring in the program.    Expected Outcomes Short: attend cardiac rehab for education and exercise. Long;  Develop and maitnain positive self care habits.    Continue Psychosocial Services  Follow up required by staff             Psychosocial Re-Evaluation:  Psychosocial Re-Evaluation     Row Name 04/12/23 [phone]             Psychosocial Re-Evaluation   Current issues with Current Stress Concerns;Current Sleep Concerns       Comments Trey Paula states that he has no major stressors at this time but does have a little stress from work. He also states that his wife and close friends are good support. He also reports no issues with sleep at this time.       Expected Outcomes Short: Continue to exercise for stress relief. Long: Continue to maintain positive outlook.       Interventions Encouraged to attend Cardiac Rehabilitation for the exercise       Continue Psychosocial Services  Follow up required by staff  Psychosocial Discharge (Final Psychosocial Re-Evaluation):  Psychosocial Re-Evaluation - 04/12/23 0946       Psychosocial Re-Evaluation   Current issues with Current Stress Concerns;Current Sleep Concerns    Comments Trey Paula states that he has no major stressors at this time but does have a little stress from work. He also states that his wife and close friends are good support. He also reports no issues with sleep at this time.    Expected Outcomes Short: Continue to exercise for stress relief. Long: Continue to maintain positive outlook.    Interventions Encouraged to attend Cardiac Rehabilitation for the exercise    Continue Psychosocial Services  Follow up required by staff             Vocational Rehabilitation: Provide vocational rehab assistance to qualifying candidates.   Vocational Rehab Evaluation & Intervention:  Vocational Rehab - 02/07/23 1644       Initial Vocational Rehab Evaluation & Intervention   Assessment shows need for Vocational Rehabilitation No             Education: Education Goals: Education classes will be provided on a  variety of topics geared toward better understanding of heart health and risk factor modification. Participant will state understanding/return demonstration of topics presented as noted by education test scores.  Learning Barriers/Preferences:  Learning Barriers/Preferences - 02/03/23 1340       Learning Barriers/Preferences   Learning Barriers None    Learning Preferences None             General Cardiac Education Topics:  AED/CPR: - Group verbal and written instruction with the use of models to demonstrate the basic use of the AED with the basic ABC's of resuscitation.   Anatomy and Cardiac Procedures: - Group verbal and visual presentation and models provide information about basic cardiac anatomy and function. Reviews the testing methods done to diagnose heart disease and the outcomes of the test results. Describes the treatment choices: Medical Management, Angioplasty, or Coronary Bypass Surgery for treating various heart conditions including Myocardial Infarction, Angina, Valve Disease, and Cardiac Arrhythmias.  Written material given at graduation. Flowsheet Row Cardiac Rehab from 04/07/2023 in [facility] Cardiac and Pulmonary Rehab  Education need identified 02/07/23       Medication Safety: - Group verbal and visual instruction to review commonly prescribed medications for heart and lung disease. Reviews the medication, class of the drug, and side effects. Includes the steps to properly store meds and maintain the prescription regimen.  Written material given at graduation.   Intimacy: - Group verbal instruction through game format to discuss how heart and lung disease can affect sexual intimacy. Written material given at graduation.. Flowsheet Row Cardiac Rehab from 04/07/2023 in [facility] Cardiac and Pulmonary Rehab  Date 03/24/23  Educator MB  Instruction Review Code 1- Verbalizes Understanding       Know Your Numbers and Heart Failure: - Group verbal and visual  instruction to discuss disease risk factors for cardiac and pulmonary disease and treatment options.  Reviews associated critical values for Overweight/Obesity, Hypertension, Cholesterol, and Diabetes.  Discusses basics of heart failure: signs/symptoms and treatments.  Introduces Heart Failure Zone chart for action plan for heart failure.  Written material given at graduation.   Infection Prevention: - Provides verbal and written material to individual with discussion of infection control including proper hand washing and proper equipment cleaning during exercise session. Flowsheet Row Cardiac Rehab from 04/07/2023 in [facility] Cardiac and Pulmonary Rehab  Date 02/07/23  Educator MB  Instruction  Review Code 1- Verbalizes Understanding       Falls Prevention: - Provides verbal and written material to individual with discussion of falls prevention and safety. Flowsheet Row Cardiac Rehab from 04/07/2023 in [facility] Cardiac and Pulmonary Rehab  Date 02/07/23  Educator MB  Instruction Review Code 1- Verbalizes Understanding       Other: -Provides group and verbal instruction on various topics (see comments)   Knowledge Questionnaire Score:  Knowledge Questionnaire Score - 02/07/23 1644       Knowledge Questionnaire Score   Pre Score 25/26             Core Components/Risk Factors/Patient Goals at Admission:  Personal Goals and Risk Factors at Admission - 02/07/23 1645       Core Components/Risk Factors/Patient Goals on Admission    Weight Management Yes;Weight Loss    Intervention Weight Management: Develop a combined nutrition and exercise program designed to reach desired caloric intake, while maintaining appropriate intake of nutrient and fiber, sodium and fats, and appropriate energy expenditure required for the weight goal.;Weight Management: Provide education and appropriate resources to help participant work on and attain dietary goals.;Weight Management/Obesity: Establish  reasonable short term and long term weight goals.    Admit Weight 187 lb 9.6 oz (85.1 kg)    Goal Weight: Short Term 182 lb 9.6 oz (82.8 kg)    Goal Weight: Long Term 177 lb 9.6 oz (80.6 kg)    Expected Outcomes Short Term: Continue to assess and modify interventions until short term weight is achieved;Long Term: Adherence to nutrition and physical activity/exercise program aimed toward attainment of established weight goal;Weight Loss: Understanding of general recommendations for a balanced deficit meal plan, which promotes 1-2 lb weight loss per week and includes a negative energy balance of [phone] kcal/d;Understanding recommendations for meals to include 15-35% energy as protein, 25-35% energy from fat, 35-60% energy from carbohydrates, less than 200mg  of dietary cholesterol, 20-35 gm of total fiber daily;Understanding of distribution of calorie intake throughout the day with the consumption of 4-5 meals/snacks    Hypertension Yes    Intervention Monitor prescription use compliance.;Provide education on lifestyle modifcations including regular physical activity/exercise, weight management, moderate sodium restriction and increased consumption of fresh fruit, vegetables, and low fat dairy, alcohol moderation, and smoking cessation.    Expected Outcomes Short Term: Continued assessment and intervention until BP is < 140/93mm HG in hypertensive participants. < 130/24mm HG in hypertensive participants with diabetes, heart failure or chronic kidney disease.;Long Term: Maintenance of blood pressure at goal levels.    Lipids Yes    Intervention Provide education and support for participant on nutrition & aerobic/resistive exercise along with prescribed medications to achieve LDL 70mg , HDL >40mg .    Expected Outcomes Short Term: Participant states understanding of desired cholesterol values and is compliant with medications prescribed. Participant is following exercise prescription and nutrition  guidelines.;Long Term: Cholesterol controlled with medications as prescribed, with individualized exercise RX and with personalized nutrition plan. Value goals: LDL < 70mg , HDL > 40 mg.             Education:Diabetes - Individual verbal and written instruction to review signs/symptoms of diabetes, desired ranges of glucose level fasting, after meals and with exercise. Acknowledge that pre and post exercise glucose checks will be done for 3 sessions at entry of program.   Core Components/Risk Factors/Patient Goals Review:   Goals and Risk Factor Review     Row Name 04/12/23 [phone]  Core Components/Risk Factors/Patient Goals Review   Personal Goals Review Weight Management/Obesity;Hypertension       Review Trey Paula weighed in today at 185.8. He states that he's not unhappy with his weight but would like to lose about 10-15 lbs and then build some muscle. He also reports that he checks his BP at home regularly. He states that his metoprolol has kept his BP lower, but it has been consistent at home and in rehab. He also continues to take all medications as prescribed.       Expected Outcomes Short: Continue to work towards weight goal through diet and exercise. Long: Continue to monitor lifestyle risk factors.                Core Components/Risk Factors/Patient Goals at Discharge (Final Review):   Goals and Risk Factor Review - 04/12/23 0948       Core Components/Risk Factors/Patient Goals Review   Personal Goals Review Weight Management/Obesity;Hypertension    Review Trey Paula weighed in today at 185.8. He states that he's not unhappy with his weight but would like to lose about 10-15 lbs and then build some muscle. He also reports that he checks his BP at home regularly. He states that his metoprolol has kept his BP lower, but it has been consistent at home and in rehab. He also continues to take all medications as prescribed.    Expected Outcomes Short: Continue to work towards  weight goal through diet and exercise. Long: Continue to monitor lifestyle risk factors.             ITP Comments:  ITP Comments     Row Name 02/03/23 1339 02/07/23 1633 02/15/23 1112 02/23/23 0959 03/23/23 0840   ITP Comments Initial phone call completed. Diagnosis can be found in [facility] 9/1. EP Orientation scheduled for Monday, 9/16 at 2:30 pm. Completed and gym orientation. Initial ITP created and sent for review to Dr. Bethann Punches, Medical Director. First full day of exercise!  Patient was oriented to gym and equipment including functions, settings, policies, and procedures.  Patient's individual exercise prescription and treatment plan were reviewed.  All starting workloads were established based on the results of the 6 minute walk test done at initial orientation visit.  The plan for exercise progression was also introduced and progression will be customized based on patient's performance and goals. 30 Day review completed. Medical Director ITP review done, changes made as directed, and signed approval by Medical Director.   new to program 30 Day review completed. Medical Director ITP review done, changes made as directed, and signed approval by Medical Director.    Row Name 04/13/23 1130           ITP Comments 30 Day review completed. Medical Director ITP review done, changes made as directed, and signed approval by Medical Director.                Comments:

## 2023-04-14 ENCOUNTER — Encounter: Payer: BC Managed Care – PPO | Admitting: *Deleted

## 2023-04-14 DIAGNOSIS — I21A1 Myocardial infarction type 2: Secondary | ICD-10-CM

## 2023-04-14 DIAGNOSIS — I252 Old myocardial infarction: Secondary | ICD-10-CM | POA: Diagnosis not present

## 2023-04-14 NOTE — Progress Notes (Signed)
Daily Session Note  Patient Details  Name: Nicholas Arnold MRN: [id number] Date of Birth: 28-Oct-1965 Referring Provider:   Flowsheet Row Cardiac Rehab from 02/07/2023 in [facility] Cardiac and Pulmonary Rehab  Referring Provider Julien Nordmann       Encounter Date: 04/14/2023  Check In:  Session Check In - 04/14/23 0924       Check-In   Supervising physician immediately available to respond to emergencies See telemetry face sheet for immediately available ER MD    Location [facility]-Cardiac & Pulmonary Rehab    Staff Present Ronette Deter, BS, Exercise Physiologist;Joseph Quon[place]aug, RCP,RRT,BSRT;Maxon Turnerville BS, , Exercise Physiologist;Nicki Gracy Katrinka Blazing, RN, [place]    Virtual Visit No    Medication changes reported     No    Fall or balance concerns reported    No    Warm-up and Cool-down Performed on first and last piece of equipment    Resistance Training Performed Yes    VAD Patient? No    PAD/SET Patient? No      Pain Assessment   Currently in Pain? No/denies                Social History   Tobacco Use  Smoking Status Never  Smokeless Tobacco Never    Goals Met:  Independence with exercise equipment Exercise tolerated well No report of concerns or symptoms today Strength training completed today  Goals Unmet:  Not Applicable  Comments: Pt able to follow exercise prescription today without complaint.  Will continue to monitor for progression.    Dr. Bethann Punches is Medical Director for [facility] Cardiac Rehabilitation.  Dr. Vida Rigger is Medical Director for [facility] Pulmonary Rehabilitation.

## 2023-04-19 ENCOUNTER — Telehealth: Payer: Self-pay | Admitting: Primary Care

## 2023-04-19 ENCOUNTER — Encounter: Payer: BC Managed Care – PPO | Admitting: *Deleted

## 2023-04-19 DIAGNOSIS — I252 Old myocardial infarction: Secondary | ICD-10-CM | POA: Diagnosis not present

## 2023-04-19 DIAGNOSIS — I21A1 Myocardial infarction type 2: Secondary | ICD-10-CM

## 2023-04-19 DIAGNOSIS — R7989 Other specified abnormal findings of blood chemistry: Secondary | ICD-10-CM

## 2023-04-19 NOTE — Telephone Encounter (Signed)
Lab orders placed.  Okay to schedule lab appointment. Also, notify him that I reviewed the heart monitor results in his chart.  Please thank him for that update.

## 2023-04-19 NOTE — Progress Notes (Signed)
Daily Session Note  Patient Details  Name: Nicholas Arnold MRN: [id number] Date of Birth: 01-Jun-1965 Referring Provider:   Flowsheet Row Cardiac Rehab from 02/07/2023 in [facility] Cardiac and Pulmonary Rehab  Referring Provider Julien Nordmann       Encounter Date: 04/19/2023  Check In:  Session Check In - 04/19/23 0932       Check-In   Supervising physician immediately available to respond to emergencies See telemetry face sheet for immediately available ER MD    Location [facility]-Cardiac & Pulmonary Rehab    Staff Present Cora Collum, RN, BSN, CCRP;Noah Tickle, BS, Exercise Physiologist;Margaret Best, MS, Exercise Physiologist;Maxon Conetta BS, , Exercise Physiologist    Virtual Visit No    Medication changes reported     No    Fall or balance concerns reported    No    Warm-up and Cool-down Performed on first and last piece of equipment    Resistance Training Performed Yes    VAD Patient? No    PAD/SET Patient? No      Pain Assessment   Currently in Pain? No/denies                Social History   Tobacco Use  Smoking Status Never  Smokeless Tobacco Never    Goals Met:  Independence with exercise equipment Exercise tolerated well No report of concerns or symptoms today  Goals Unmet:  Not Applicable  Comments: Pt able to follow exercise prescription today without complaint.  Will continue to monitor for progression.    Dr. Bethann Punches is Medical Director for [facility] Cardiac Rehabilitation.  Dr. Vida Rigger is Medical Director for [facility] Pulmonary Rehabilitation.

## 2023-04-19 NOTE — Addendum Note (Signed)
Addended by: Doreene Nest on: 04/19/2023 05:14 PM   Modules accepted: Orders

## 2023-04-19 NOTE — Telephone Encounter (Signed)
Pt called stating he was told to repeat his labs by [facility]. Can orders be submitted? Pt also stated he was placed on a heart monitor by a hosp in [place], [place] & the hosp stated they'd send a copy of test results to [facility]. Pt asked if our office ever received the results? Call back # [phone]

## 2023-04-20 NOTE — Telephone Encounter (Signed)
Patient has been scheduled

## 2023-04-26 ENCOUNTER — Encounter: Payer: BC Managed Care – PPO | Attending: Cardiovascular Disease | Admitting: *Deleted

## 2023-04-26 DIAGNOSIS — I252 Old myocardial infarction: Secondary | ICD-10-CM | POA: Diagnosis not present

## 2023-04-26 DIAGNOSIS — I21A1 Myocardial infarction type 2: Secondary | ICD-10-CM

## 2023-04-26 DIAGNOSIS — Z48812 Encounter for surgical aftercare following surgery on the circulatory system: Secondary | ICD-10-CM | POA: Insufficient documentation

## 2023-04-26 NOTE — Progress Notes (Signed)
Daily Session Note  Patient Details  Name: Nicholas Arnold MRN: [id number] Date of Birth: December 07, 1965 Referring Provider:   Flowsheet Row Cardiac Rehab from 02/07/2023 in [facility] Cardiac and Pulmonary Rehab  Referring Provider Julien Nordmann       Encounter Date: 04/26/2023  Check In:  Session Check In - 04/26/23 0928       Check-In   Supervising physician immediately available to respond to emergencies See telemetry face sheet for immediately available ER MD    Location [facility]-Cardiac & Pulmonary Rehab    Staff Present Maxon Conetta BS, , Exercise Physiologist;Margaret Best, MS, Exercise Physiologist;Islay Polanco, RN, BSN, CCRP    Virtual Visit No    Medication changes reported     No    Fall or balance concerns reported    No    Warm-up and Cool-down Performed on first and last piece of equipment    Resistance Training Performed Yes    VAD Patient? No    PAD/SET Patient? No      Pain Assessment   Currently in Pain? No/denies                Social History   Tobacco Use  Smoking Status Never  Smokeless Tobacco Never    Goals Met:  Independence with exercise equipment Exercise tolerated well No report of concerns or symptoms today  Goals Unmet:  Not Applicable  Comments: Pt able to follow exercise prescription today without complaint.  Will continue to monitor for progression.    Dr. Bethann Punches is Medical Director for [facility] Cardiac Rehabilitation.  Dr. Vida Rigger is Medical Director for [facility] Pulmonary Rehabilitation.

## 2023-04-26 NOTE — Progress Notes (Signed)
Assessment start time: 8:15 AM  Digestive issues/concerns: no known food allergies   24-hours Recall: B: oatmeal, banana, coffee with sugar creamer L: grilled chicken, broccoli D: shrimp, salad  Beverages coffee, water (180oz) Alcohol none Caffeine coffee  Education r/t nutrition plan Patient drinking plenty of water daily. Gave up alcohol since heart attack. He has cut back on butter and red meat since then as well. Reviewed his 24hr food recall and discussed pairing protein with carbs to creat a more balanced meal. Went over [employer] handout, educated on types of fats, sources, and how to read label. Spoke about cutting back on sodium and saturated fat. Commended him on several of the changes he has already made. Built out several meals and snacks with foods he likes focusing on making protein rich meals with controlled portions of carbs keeping saturated fat and sodium down.   Goal 1: Eat 15-30gProtein and 30-60gCarbs at each meal. Goal 2: Include more colorful produce, aim for 5-8 servings of fruits and veggies per day Goal 3: Reduce saturated fat, less than 12g per day. Replace bad fats for more heart healthy fats.   End time 9:04 AM

## 2023-04-28 ENCOUNTER — Encounter: Payer: BC Managed Care – PPO | Admitting: *Deleted

## 2023-04-28 DIAGNOSIS — I252 Old myocardial infarction: Secondary | ICD-10-CM | POA: Diagnosis not present

## 2023-04-28 DIAGNOSIS — Z48812 Encounter for surgical aftercare following surgery on the circulatory system: Secondary | ICD-10-CM | POA: Diagnosis not present

## 2023-04-28 DIAGNOSIS — I21A1 Myocardial infarction type 2: Secondary | ICD-10-CM

## 2023-04-28 NOTE — Progress Notes (Signed)
Daily Session Note  Patient Details  Name: Nicholas Arnold MRN: [id number] Date of Birth: 1965/07/25 Referring Provider:   Flowsheet Row Cardiac Rehab from 02/07/2023 in [facility] Cardiac and Pulmonary Rehab  Referring Provider Julien Nordmann       Encounter Date: 04/28/2023  Check In:  Session Check In - 04/28/23 0909       Check-In   Supervising physician immediately available to respond to emergencies See telemetry face sheet for immediately available ER MD    Location [facility]-Cardiac & Pulmonary Rehab    Staff Present Elige Ko, RCP,RRT,BSRT;Noah Tickle, BS, Exercise Physiologist;Maxon Conetta BS, , Exercise Physiologist    Virtual Visit No    Medication changes reported     No    Fall or balance concerns reported    No    Warm-up and Cool-down Performed on first and last piece of equipment    Resistance Training Performed Yes    VAD Patient? No      Pain Assessment   Currently in Pain? No/denies    Multiple Pain Sites No                Social History   Tobacco Use  Smoking Status Never  Smokeless Tobacco Never    Goals Met:  Independence with exercise equipment Exercise tolerated well No report of concerns or symptoms today Strength training completed today  Goals Unmet:  Not Applicable  Comments: Pt able to follow exercise prescription today without complaint.  Will continue to monitor for progression.    Dr. Bethann Punches is Medical Director for [facility] Cardiac Rehabilitation.  Dr. Vida Rigger is Medical Director for [facility] Pulmonary Rehabilitation.

## 2023-04-29 DIAGNOSIS — G4733 Obstructive sleep apnea (adult) (pediatric): Secondary | ICD-10-CM | POA: Diagnosis not present

## 2023-05-03 ENCOUNTER — Encounter: Payer: BC Managed Care – PPO | Admitting: *Deleted

## 2023-05-03 VITALS — Ht 68.0 in | Wt 186.0 lb

## 2023-05-03 DIAGNOSIS — I252 Old myocardial infarction: Secondary | ICD-10-CM | POA: Diagnosis not present

## 2023-05-03 DIAGNOSIS — I21A1 Myocardial infarction type 2: Secondary | ICD-10-CM

## 2023-05-03 DIAGNOSIS — Z48812 Encounter for surgical aftercare following surgery on the circulatory system: Secondary | ICD-10-CM | POA: Diagnosis not present

## 2023-05-03 NOTE — Progress Notes (Signed)
Daily Session Note  Patient Details  Name: Nicholas Arnold MRN: [id number] Date of Birth: [birth year] Referring Provider:   Flowsheet Row Cardiac Rehab from 02/07/2023 in [facility] Cardiac and Pulmonary Rehab  Referring Provider Julien Nordmann       Encounter Date: 05/03/2023  Check In:  Session Check In - 05/03/23 9518       Check-In   Supervising physician immediately available to respond to emergencies See telemetry face sheet for immediately available ER MD    Location [facility]-Cardiac & Pulmonary Rehab    Staff Present Cora Collum, RN, BSN, CCRP;Noah Tickle, BS, Exercise Physiologist;Margaret Best, MS, Exercise Physiologist;Maxon Conetta BS, , Exercise Physiologist    Virtual Visit No    Medication changes reported     No    Fall or balance concerns reported    No    Warm-up and Cool-down Performed on first and last piece of equipment    Resistance Training Performed Yes    VAD Patient? No    PAD/SET Patient? No      Pain Assessment   Currently in Pain? No/denies                Social History   Tobacco Use  Smoking Status Never  Smokeless Tobacco Never    Goals Met:  Independence with exercise equipment Exercise tolerated well No report of concerns or symptoms today  Goals Unmet:  Not Applicable  Comments: Pt able to follow exercise prescription today without complaint.  Will continue to monitor for progression.   6 Minute Walk     Row Name 02/07/23 1633 05/03/23 0940       6 Minute Walk   Phase Initial Discharge    Distance 1890 feet 2030 feet    Distance % Change -- 7.4 %    Distance Feet Change -- 140 ft    Walk Time 6 minutes 6 minutes    # of Rest Breaks 0 0    MPH 3.58 3.84    METS 4.97 4.93    RPE 7 11    Perceived Dyspnea  0 0    VO2 Peak 17.39 17.25    Symptoms No No    Resting HR 67 bpm 67 bpm    Resting BP 92/62 104/62    Resting Oxygen Saturation  98 % 98 %    Exercise Oxygen Saturation  during 6 min walk 99 % 94 %    Max Ex. HR  127 bpm 104 bpm    Max Ex. BP 126/70 --    2 Minute Post BP 112/74 --                Dr. Bethann Punches is Medical Director for [facility] Cardiac Rehabilitation.  Dr. Vida Rigger is Medical Director for [facility] Pulmonary Rehabilitation.

## 2023-05-03 NOTE — Patient Instructions (Addendum)
Discharge Patient Instructions  Patient Details  Name: Nicholas Arnold MRN: [id number] Date of Birth: 1965/08/01 Referring Provider:  Doreene Nest, NP   Number of Visits: 36  Reason for Discharge:  Patient reached a stable level of exercise. Patient independent in their exercise. Patient has met program and personal goals.  Diagnosis:  Type 2 MI (myocardial infarction) [facility])  Initial Exercise Prescription:  Initial Exercise Prescription - 02/07/23 1600       Date of Initial Exercise RX and Referring Provider   Date 02/07/23    Referring Provider Julien Nordmann      Oxygen   Maintain Oxygen Saturation 88% or higher      Treadmill   MPH 3.8    Grade 0    Minutes 15    METs 3.91      Elliptical   Level 2    Speed 3    Minutes 15    METs 4.97      T5 Nustep   Level 4    SPM 80    Minutes 15    METs 4.97      Prescription Details   Frequency (times per week) 2    Duration Progress to 30 minutes of continuous aerobic without signs/symptoms of physical distress      Intensity   THRR 40-80% of Max Heartrate 106-146    Ratings of Perceived Exertion 11-13    Perceived Dyspnea 0-4      Progression   Progression Continue to progress workloads to maintain intensity without signs/symptoms of physical distress.      Resistance Training   Training Prescription Yes    Weight 10 lb    Reps 10-15             Discharge Exercise Prescription (Final Exercise Prescription Changes):  Exercise Prescription Changes - 04/20/23 1100       Response to Exercise   Blood Pressure (Admit) 102/62    Blood Pressure (Exit) 102/60    Heart Rate (Admit) 73 bpm    Heart Rate (Exercise) 148 bpm    Heart Rate (Exit) 86 bpm    Oxygen Saturation (Admit) 98 %    Oxygen Saturation (Exercise) 94 %    Oxygen Saturation (Exit) 95 %    Rating of Perceived Exertion (Exercise) 14    Symptoms none    Duration Continue with 30 min of aerobic exercise without signs/symptoms of  physical distress.    Intensity THRR unchanged      Progression   Progression Continue to progress workloads to maintain intensity without signs/symptoms of physical distress.    Average METs 6.86      Resistance Training   Training Prescription Yes    Weight 10 lb    Reps 10-15      Interval Training   Interval Training No      Treadmill   MPH 4    Grade 6    Minutes 15    METs 7.37      Elliptical   Level 8    Speed 3.8    Minutes 15    METs 7.1      Home Exercise Plan   Plans to continue exercise at Home (comment)   Treadmill at home   Frequency Add 2 additional days to program exercise sessions.    Initial Home Exercises Provided 03/29/23      Oxygen   Maintain Oxygen Saturation 88% or higher  Functional Capacity:  6 Minute Walk     Row Name 02/07/23 1633 05/03/23 0940       6 Minute Walk   Phase Initial Discharge    Distance 1890 feet 2030 feet    Distance % Change -- 7.4 %    Distance Feet Change -- 140 ft    Walk Time 6 minutes 6 minutes    # of Rest Breaks 0 0    MPH 3.58 3.84    METS 4.97 4.93    RPE 7 11    Perceived Dyspnea  0 0    VO2 Peak 17.39 17.25    Symptoms No No    Resting HR 67 bpm 67 bpm    Resting BP 92/62 104/62    Resting Oxygen Saturation  98 % 98 %    Exercise Oxygen Saturation  during 6 min walk 99 % 94 %    Max Ex. HR 127 bpm 104 bpm    Max Ex. BP 126/70 --    2 Minute Post BP 112/74 --              Nutrition & Weight - Outcomes:  Pre Biometrics - 02/07/23 1640       Pre Biometrics   Height 5\' 8"  (1.727 m)    Weight 187 lb 9.6 oz (85.1 kg)    Waist Circumference 39.5 inches    Hip Circumference 38.2 inches    Waist to Hip Ratio 1.03 %    BMI (Calculated) 28.53    Single Leg Stand 30 seconds             Post Biometrics - 05/03/23 0947        Post  Biometrics   Height 5\' 8"  (1.727 m)    Weight 186 lb (84.4 kg)    Waist Circumference 38.5 inches    Hip Circumference 37 inches     Waist to Hip Ratio 1.04 %    BMI (Calculated) 28.29    Single Leg Stand 30 seconds             Nutrition:  Nutrition Therapy & Goals - 04/26/23 0934       Nutrition Therapy   Diet Cardiac, Low Na    Protein (specify units) 90    Fiber 30 grams    Whole Grain Foods 3 servings    Saturated Fats 15 max. grams    Fruits and Vegetables 5 servings/day    Sodium 2 grams      Personal Nutrition Goals   Nutrition Goal Eat 15-30gProtein and 30-60gCarbs at each meal.    Personal Goal #2 Include more colorful produce, aim for 5-8 servings of fruits and veggies per day    Personal Goal #3 Reduce saturated fat, less than 12g per day. Replace bad fats for more heart healthy fats.    Comments Patient drinking plenty of water daily. Gave up alcohol since heart attack. He has cut back on butter and red meat since then as well. Reviewed his 24hr food recall and discussed pairing protein with carbs to creat a more balanced meal. Went over [employer] handout, educated on types of fats, sources, and how to read label. Spoke about cutting back on sodium and saturated fat. Commended him on several of the changes he has already made. Built out several meals and snacks with foods he likes focusing on making protein rich meals with controlled portions of carbs keeping saturated fat and sodium down.  Intervention Plan   Intervention Prescribe, educate and counsel regarding individualized specific dietary modifications aiming towards targeted core components such as weight, hypertension, lipid management, diabetes, heart failure and other comorbidities.;Nutrition handout(s) given to patient.    Expected Outcomes Short Term Goal: Understand basic principles of dietary content, such as calories, fat, sodium, cholesterol and nutrients.;Long Term Goal: Adherence to prescribed nutrition plan.;Short Term Goal: A plan has been developed with personal nutrition goals set during dietitian appointment.

## 2023-05-05 ENCOUNTER — Encounter: Payer: BC Managed Care – PPO | Admitting: *Deleted

## 2023-05-05 DIAGNOSIS — Z48812 Encounter for surgical aftercare following surgery on the circulatory system: Secondary | ICD-10-CM | POA: Diagnosis not present

## 2023-05-05 DIAGNOSIS — I21A1 Myocardial infarction type 2: Secondary | ICD-10-CM

## 2023-05-05 DIAGNOSIS — I252 Old myocardial infarction: Secondary | ICD-10-CM | POA: Diagnosis not present

## 2023-05-05 NOTE — Progress Notes (Signed)
Daily Session Note  Patient Details  Name: SUFYAN SCHOOL MRN: [id number] Date of Birth: November 04, 1965 Referring Provider:   Flowsheet Row Cardiac Rehab from 02/07/2023 in [facility] Cardiac and Pulmonary Rehab  Referring Provider Julien Nordmann       Encounter Date: 05/05/2023  Check In:  Session Check In - 05/05/23 5284       Check-In   Supervising physician immediately available to respond to emergencies See telemetry face sheet for immediately available ER MD    Location [facility]-Cardiac & Pulmonary Rehab    Staff Present Ronette Deter, BS, Exercise Physiologist;Meredith Jewel Baize, RN Atilano Median, RN, ADN;Maxon Conetta BS, , Exercise Physiologist    Virtual Visit No    Medication changes reported     No    Fall or balance concerns reported    No    Warm-up and Cool-down Performed on first and last piece of equipment    Resistance Training Performed Yes    VAD Patient? No    PAD/SET Patient? No      Pain Assessment   Currently in Pain? No/denies                Social History   Tobacco Use  Smoking Status Never  Smokeless Tobacco Never    Goals Met:  Independence with exercise equipment Exercise tolerated well No report of concerns or symptoms today Strength training completed today  Goals Unmet:  Not Applicable  Comments: Pt able to follow exercise prescription today without complaint.  Will continue to monitor for progression.    Dr. Bethann Punches is Medical Director for [facility] Cardiac Rehabilitation.  Dr. Vida Rigger is Medical Director for [facility] Pulmonary Rehabilitation.

## 2023-05-09 ENCOUNTER — Encounter: Payer: Self-pay | Admitting: Cardiovascular Disease

## 2023-05-10 ENCOUNTER — Other Ambulatory Visit: Payer: Self-pay

## 2023-05-10 ENCOUNTER — Other Ambulatory Visit (INDEPENDENT_AMBULATORY_CARE_PROVIDER_SITE_OTHER): Payer: BC Managed Care – PPO

## 2023-05-10 ENCOUNTER — Encounter: Payer: BC Managed Care – PPO | Admitting: *Deleted

## 2023-05-10 DIAGNOSIS — R7989 Other specified abnormal findings of blood chemistry: Secondary | ICD-10-CM | POA: Diagnosis not present

## 2023-05-10 DIAGNOSIS — Z48812 Encounter for surgical aftercare following surgery on the circulatory system: Secondary | ICD-10-CM | POA: Diagnosis not present

## 2023-05-10 DIAGNOSIS — I252 Old myocardial infarction: Secondary | ICD-10-CM | POA: Diagnosis not present

## 2023-05-10 DIAGNOSIS — I21A1 Myocardial infarction type 2: Secondary | ICD-10-CM

## 2023-05-10 LAB — HEPATIC FUNCTION PANEL
ALT: 107 U/L — ABNORMAL HIGH (ref 0–53)
AST: 51 U/L — ABNORMAL HIGH (ref 0–37)
Albumin: 4.2 g/dL (ref 3.5–5.2)
Alkaline Phosphatase: 58 U/L (ref 39–117)
Bilirubin, Direct: 0.2 mg/dL (ref 0.0–0.3)
Total Bilirubin: 0.8 mg/dL (ref 0.2–1.2)
Total Protein: 6.2 g/dL (ref 6.0–8.3)

## 2023-05-10 MED ORDER — METOPROLOL SUCCINATE ER 25 MG PO TB24: 25.0000 mg | ORAL_TABLET | Freq: Every day | ORAL | 3 refills | Status: DC

## 2023-05-10 NOTE — Progress Notes (Signed)
Daily Session Note  Patient Details  Name: Nicholas Arnold MRN: [id number] Date of Birth: Jan 28, 1966 Referring Provider:   Flowsheet Row Cardiac Rehab from 02/07/2023 in [facility] Cardiac and Pulmonary Rehab  Referring Provider Julien Nordmann       Encounter Date: 05/10/2023  Check In:  Session Check In - 05/10/23 0920       Check-In   Supervising physician immediately available to respond to emergencies See telemetry face sheet for immediately available ER MD    Location [facility]-Cardiac & Pulmonary Rehab    Staff Present Cora Collum, RN, BSN, CCRP;Margaret Best, MS, Exercise Physiologist;Noah Tickle, BS, Exercise Physiologist;Maxon Conetta BS, , Exercise Physiologist    Virtual Visit No    Medication changes reported     No    Fall or balance concerns reported    No    Warm-up and Cool-down Performed on first and last piece of equipment    Resistance Training Performed Yes    VAD Patient? No    PAD/SET Patient? No      Pain Assessment   Currently in Pain? No/denies                Social History   Tobacco Use  Smoking Status Never  Smokeless Tobacco Never    Goals Met:  Independence with exercise equipment Exercise tolerated well No report of concerns or symptoms today  Goals Unmet:  Not Applicable  Comments: Pt able to follow exercise prescription today without complaint.  Will continue to monitor for progression.    Dr. Bethann Punches is Medical Director for [facility] Cardiac Rehabilitation.  Dr. Vida Rigger is Medical Director for [facility] Pulmonary Rehabilitation.

## 2023-05-11 ENCOUNTER — Encounter: Payer: Self-pay | Admitting: *Deleted

## 2023-05-11 DIAGNOSIS — I21A1 Myocardial infarction type 2: Secondary | ICD-10-CM

## 2023-05-11 NOTE — Progress Notes (Signed)
Cardiac Individual Treatment Plan  Patient Details  Name: Nicholas Arnold MRN: [id number] Date of Birth: 1966-03-06 Referring Provider:   Flowsheet Row Cardiac Rehab from 02/07/2023 in [facility] Cardiac and Pulmonary Rehab  Referring Provider Julien Nordmann       Initial Encounter Date:  Flowsheet Row Cardiac Rehab from 02/07/2023 in [facility] Cardiac and Pulmonary Rehab  Date 02/07/23       Visit Diagnosis: Type 2 MI (myocardial infarction) (HCC)  Patient's Home Medications on Admission:  Current Outpatient Medications:    ascorbic acid (VITAMIN C) 250 MG CHEW, Chew 2 tablets by mouth daily., Disp: , Rfl:    aspirin EC 81 MG tablet, Take 81 mg by mouth daily. Swallow whole., Disp: , Rfl:    atorvastatin (LIPITOR) 80 MG tablet, Take 1 tablet (80 mg total) by mouth daily., Disp: 90 tablet, Rfl: 3   Cholecalciferol 10 MCG (400 UNIT) CHEW, Chew 2 tablets by mouth daily., Disp: , Rfl:    metoprolol succinate (TOPROL-XL) 25 MG 24 hr tablet, Take 1 tablet (25 mg total) by mouth daily. Take with or immediately following a meal., Disp: 90 tablet, Rfl: 3  Past Medical History: Past Medical History:  Diagnosis Date   Chickenpox    GERD (gastroesophageal reflux disease)    Hyperlipemia    Neck mass    removed in 2012, benign   Prediabetes    Pyloric stenosis     Tobacco Use: Social History   Tobacco Use  Smoking Status Never  Smokeless Tobacco Never    Labs: Review Flowsheet  More data exists      Latest Ref Rng & Units 06/13/2015 08/13/2016 02/08/2017 06/24/2020 04/05/2023  Labs for ITP Cardiac and Pulmonary Rehab  Cholestrol 0 - 200 mg/dL 010  272  536  644  034   LDL (calc) 0 - 99 mg/dL 742  595  638  756  63   HDL-C >39.00 mg/dL 43.32  95.18  84.16  60.63  38.30   Trlycerides 0.0 - 149.0 mg/dL 01.6  010.9  323.5  57.3  64.0   Hemoglobin A1c 4.6 - 6.5 % - 5.8  5.6  5.7  -     Exercise Target Goals: Exercise Program Goal: Individual exercise prescription set using results from  initial 6 min walk test and THRR while considering  patient's activity barriers and safety.   Exercise Prescription Goal: Initial exercise prescription builds to 30-45 minutes a day of aerobic activity, 2-3 days per week.  Home exercise guidelines will be given to patient during program as part of exercise prescription that the participant will acknowledge.   Education: Aerobic Exercise: - Group verbal and visual presentation on the components of exercise prescription. Introduces F.I.T.T principle from ACSM for exercise prescriptions.  Reviews F.I.T.T. principles of aerobic exercise including progression. Written material given at graduation. Flowsheet Row Cardiac Rehab from 05/05/2023 in [facility] Cardiac and Pulmonary Rehab  Date 03/24/23  Educator MB  Instruction Review Code 1- Verbalizes Understanding       Education: Resistance Exercise: - Group verbal and visual presentation on the components of exercise prescription. Introduces F.I.T.T principle from ACSM for exercise prescriptions  Reviews F.I.T.T. principles of resistance exercise including progression. Written material given at graduation. Flowsheet Row Cardiac Rehab from 05/05/2023 in [facility] Cardiac and Pulmonary Rehab  Date 03/17/23  Educator MB  Instruction Review Code 1- [employer] Understanding        Education: Exercise & Equipment Safety: - Individual verbal instruction and demonstration of equipment use  and safety with use of the equipment. Flowsheet Row Cardiac Rehab from 05/05/2023 in [facility] Cardiac and Pulmonary Rehab  Date 02/07/23  Educator MB  Instruction Review Code 1- Verbalizes Understanding       Education: Exercise Physiology & General Exercise Guidelines: - Group verbal and written instruction with models to review the exercise physiology of the cardiovascular system and associated critical values. Provides general exercise guidelines with specific guidelines to those with heart or lung disease.  Flowsheet  Row Cardiac Rehab from 05/05/2023 in [facility] Cardiac and Pulmonary Rehab  Date 03/10/23  Educator MB  Instruction Review Code 1- [employer] Understanding       Education: Flexibility, Balance, Mind/Body Relaxation: - Group verbal and visual presentation with interactive activity on the components of exercise prescription. Introduces F.I.T.T principle from ACSM for exercise prescriptions. Reviews F.I.T.T. principles of flexibility and balance exercise training including progression. Also discusses the mind body connection.  Reviews various relaxation techniques to help reduce and manage stress (i.e. Deep breathing, progressive muscle relaxation, and visualization). Balance handout provided to take home. Written material given at graduation. Flowsheet Row Cardiac Rehab from 05/05/2023 in [facility] Cardiac and Pulmonary Rehab  Date 03/17/23  Educator MB  Instruction Review Code 1- Verbalizes Understanding       Activity Barriers & Risk Stratification:  Activity Barriers & Cardiac Risk Stratification - 02/07/23 1635       Activity Barriers & Cardiac Risk Stratification   Activity Barriers None    Cardiac Risk Stratification Moderate             6 Minute Walk:  6 Minute Walk     Row Name 02/07/23 1633 05/03/23 0940       6 Minute Walk   Phase Initial Discharge    Distance 1890 feet 2030 feet    Distance % Change -- 7.4 %    Distance Feet Change -- 140 ft    Walk Time 6 minutes 6 minutes    # of Rest Breaks 0 0    MPH 3.58 3.84    METS 4.97 4.93    RPE 7 11    Perceived Dyspnea  0 0    VO2 Peak 17.39 17.25    Symptoms No No    Resting HR 67 bpm 67 bpm    Resting BP 92/62 104/62    Resting Oxygen Saturation  98 % 98 %    Exercise Oxygen Saturation  during 6 min walk 99 % 94 %    Max Ex. HR 127 bpm 104 bpm    Max Ex. BP 126/70 --    2 Minute Post BP 112/74 --             Oxygen Initial Assessment:   Oxygen Re-Evaluation:   Oxygen Discharge (Final Oxygen  Re-Evaluation):   Initial Exercise Prescription:  Initial Exercise Prescription - 02/07/23 1600       Date of Initial Exercise RX and Referring Provider   Date 02/07/23    Referring Provider Julien Nordmann      Oxygen   Maintain Oxygen Saturation 88% or higher      Treadmill   MPH 3.8    Grade 0    Minutes 15    METs 3.91      Elliptical   Level 2    Speed 3    Minutes 15    METs 4.97      T5 Nustep   Level 4    SPM 80  Minutes 15    METs 4.97      Prescription Details   Frequency (times per week) 2    Duration Progress to 30 minutes of continuous aerobic without signs/symptoms of physical distress      Intensity   THRR 40-80% of Max Heartrate 106-146    Ratings of Perceived Exertion 11-13    Perceived Dyspnea 0-4      Progression   Progression Continue to progress workloads to maintain intensity without signs/symptoms of physical distress.      Resistance Training   Training Prescription Yes    Weight 10 lb    Reps 10-15             Perform Capillary Blood Glucose checks as needed.  Exercise Prescription Changes:   Exercise Prescription Changes     Row Name 02/07/23 1600 02/24/23 1700 03/10/23 1400 03/24/23 0800 03/29/23 1000     Response to Exercise   Blood Pressure (Admit) 92/62 110/68 118/78 104/70 --   Blood Pressure (Exercise) 126/70 132/68 160/70 148/74 --   Blood Pressure (Exit) 112/74 114/64 124/72 104/54 --   Heart Rate (Admit) 67 bpm 65 bpm 66 bpm 68 bpm --   Heart Rate (Exercise) 127 bpm 132 bpm 149 bpm 138 bpm --   Heart Rate (Exit) 68 bpm 86 bpm 81 bpm 95 bpm --   Oxygen Saturation (Admit) 98 % -- -- -- --   Oxygen Saturation (Exercise) 99 % -- -- -- --   Oxygen Saturation (Exit) 99 % -- -- -- --   Rating of Perceived Exertion (Exercise) 7 12 13 13  --   Perceived Dyspnea (Exercise) 0 0 0 0 --   Symptoms none none none none --   Comments results -- -- -- --   Duration Progress to 30 minutes of  aerobic without  signs/symptoms of physical distress Progress to 30 minutes of  aerobic without signs/symptoms of physical distress Progress to 30 minutes of  aerobic without signs/symptoms of physical distress Progress to 30 minutes of  aerobic without signs/symptoms of physical distress --   Intensity THRR New THRR unchanged THRR unchanged THRR unchanged --     Progression   Progression Continue to progress workloads to maintain intensity without signs/symptoms of physical distress. Continue to progress workloads to maintain intensity without signs/symptoms of physical distress. Continue to progress workloads to maintain intensity without signs/symptoms of physical distress. Continue to progress workloads to maintain intensity without signs/symptoms of physical distress. --   Average METs 4.97 3.3 4.6 5.62 --     Resistance Training   Training Prescription -- Yes Yes Yes --   Weight -- 10 lb 10 lb 10 lb --   Reps -- 10-15 10-15 10-15 --     Interval Training   Interval Training -- No No No --     Oxygen   Oxygen -- Continuous -- -- --     Treadmill   MPH -- 3.9 4 4  --   Grade -- 0 2 3 --   Minutes -- 15 15 15  --   METs -- 3.99 5.17 5.72 --     Elliptical   Level -- 4 5 5  --   Speed -- 3.7 4.5 6.2 --   Minutes -- 15 15 15  --   METs -- -- -- 5.5 --     T5 Nustep   Level -- 4 4 -- --   Minutes -- 15 15 -- --   METs -- 2.3 3.9 -- --  Home Exercise Plan   Plans to continue exercise at -- -- -- -- Home (comment)  Treadmill at home   Frequency -- -- -- -- Add 2 additional days to program exercise sessions.   Initial Home Exercises Provided -- -- -- -- 03/29/23     Oxygen   Maintain Oxygen Saturation -- 88% or higher 88% or higher 88% or higher --    Row Name 04/06/23 1500 04/20/23 1100 05/05/23 1700         Response to Exercise   Blood Pressure (Admit) 98/62 102/62 112/64     Blood Pressure (Exercise) 144/58 -- --     Blood Pressure (Exit) 102/58 102/60 94/60     Heart Rate (Admit) 70  bpm 73 bpm 70 bpm     Heart Rate (Exercise) 133 bpm 148 bpm 128 bpm     Heart Rate (Exit) 89 bpm 86 bpm 88 bpm     Oxygen Saturation (Admit) -- 98 % 96 %     Oxygen Saturation (Exercise) -- 94 % 94 %     Oxygen Saturation (Exit) -- 95 % 95 %     Rating of Perceived Exertion (Exercise) 14 14 13      Perceived Dyspnea (Exercise) -- -- 0     Symptoms none none none     Duration Continue with 30 min of aerobic exercise without signs/symptoms of physical distress. Continue with 30 min of aerobic exercise without signs/symptoms of physical distress. Continue with 30 min of aerobic exercise without signs/symptoms of physical distress.     Intensity THRR unchanged THRR unchanged THRR unchanged       Progression   Progression Continue to progress workloads to maintain intensity without signs/symptoms of physical distress. Continue to progress workloads to maintain intensity without signs/symptoms of physical distress. Continue to progress workloads to maintain intensity without signs/symptoms of physical distress.     Average METs 6.18 6.86 6       Resistance Training   Training Prescription Yes Yes Yes     Weight 10 lb 10 lb 10 lb     Reps 10-15 10-15 10-15       Interval Training   Interval Training No No No       Treadmill   MPH 4 4 4      Grade 4 6 4      Minutes 15 15 15      METs 6.27 7.37 6.27       Elliptical   Level 6 8 12      Speed 6.2 3.8 5.8     Minutes 15 15 15      METs 6.27 7.1 --       Home Exercise Plan   Plans to continue exercise at Home (comment)  Treadmill at home Home (comment)  Treadmill at home Home (comment)  Treadmill at home     Frequency Add 2 additional days to program exercise sessions. Add 2 additional days to program exercise sessions. Add 2 additional days to program exercise sessions.     Initial Home Exercises Provided 03/29/23 03/29/23 03/29/23       Oxygen   Maintain Oxygen Saturation 88% or higher 88% or higher 88% or higher               Exercise Comments:   Exercise Comments     Row Name 02/15/23 1112           Exercise Comments First full day of exercise!  Patient was oriented to gym and  equipment including functions, settings, policies, and procedures.  Patient's individual exercise prescription and treatment plan were reviewed.  All starting workloads were established based on the results of the 6 minute walk test done at initial orientation visit.  The plan for exercise progression was also introduced and progression will be customized based on patient's performance and goals.                Exercise Goals and Review:   Exercise Goals     Row Name 02/07/23 1640             Exercise Goals   Increase Physical Activity Yes       Intervention Provide advice, education, support and counseling about physical activity/exercise needs.;Develop an individualized exercise prescription for aerobic and resistive training based on initial evaluation findings, risk stratification, comorbidities and participant's personal goals.       Expected Outcomes Short Term: Attend rehab on a regular basis to increase amount of physical activity.;Long Term: Exercising regularly at least 3-5 days a week.;Long Term: Add in home exercise to make exercise part of routine and to increase amount of physical activity.       Increase Strength and Stamina Yes       Intervention Provide advice, education, support and counseling about physical activity/exercise needs.;Develop an individualized exercise prescription for aerobic and resistive training based on initial evaluation findings, risk stratification, comorbidities and participant's personal goals.       Expected Outcomes Short Term: Increase workloads from initial exercise prescription for resistance, speed, and METs.;Short Term: Perform resistance training exercises routinely during rehab and add in resistance training at home;Long Term: Improve cardiorespiratory fitness, muscular  endurance and strength as measured by increased METs and functional capacity ( )       Able to understand and use rate of perceived exertion (RPE) scale Yes       Intervention Provide education and explanation on how to use RPE scale       Expected Outcomes Short Term: Able to use RPE daily in rehab to express subjective intensity level;Long Term:  Able to use RPE to guide intensity level when exercising independently       Able to understand and use Dyspnea scale Yes       Intervention Provide education and explanation on how to use Dyspnea scale       Expected Outcomes Short Term: Able to use Dyspnea scale daily in rehab to express subjective sense of shortness of breath during exertion;Long Term: Able to use Dyspnea scale to guide intensity level when exercising independently       Knowledge and understanding of Target Heart Rate Range (THRR) Yes       Intervention Provide education and explanation of THRR including how the numbers were predicted and where they are located for reference       Expected Outcomes Short Term: Able to state/look up THRR;Long Term: Able to use THRR to govern intensity when exercising independently;Short Term: Able to use daily as guideline for intensity in rehab       Able to check pulse independently Yes       Intervention Provide education and demonstration on how to check pulse in carotid and radial arteries.;Review the importance of being able to check your own pulse for safety during independent exercise       Expected Outcomes Long Term: Able to check pulse independently and accurately;Short Term: Able to explain why pulse checking is important during independent exercise  Understanding of Exercise Prescription Yes       Intervention Provide education, explanation, and written materials on patient's individual exercise prescription       Expected Outcomes Short Term: Able to explain program exercise prescription;Long Term: Able to explain home exercise  prescription to exercise independently                Exercise Goals Re-Evaluation :  Exercise Goals Re-Evaluation     Row Name 02/15/23 1112 02/24/23 1733 03/10/23 1427 03/24/23 0838 03/29/23 1004     Exercise Goal Re-Evaluation   Exercise Goals Review Able to understand and use rate of perceived exertion (RPE) scale;Able to understand and use Dyspnea scale;Knowledge and understanding of Target Heart Rate Range (THRR);Understanding of Exercise Prescription Increase Physical Activity;Increase Strength and Stamina;Understanding of Exercise Prescription Increase Physical Activity;Increase Strength and Stamina;Understanding of Exercise Prescription Increase Physical Activity;Increase Strength and Stamina;Understanding of Exercise Prescription Increase Physical Activity;Able to understand and use Dyspnea scale;Understanding of Exercise Prescription;Increase Strength and Stamina;Knowledge and understanding of Target Heart Rate Range (THRR);Able to check pulse independently;Able to understand and use rate of perceived exertion (RPE) scale   Comments Reviewed RPE and dyspnea scale, THR and program prescription with pt today.  Pt voiced understanding and was given a copy of goals to take home. Trey Paula is off to a great start in the program. He has attended his first 2 sessions during this review. He was able to increase his Treadmill speed from 3. to 3.46mph. We will continue to monitor his progress in the program. Trey Paula continues to do well in the program. He has increased his speed on the elliptical at level 1, from 3. to 4.40mph. He also increased his incline on the treadmill from 0% to 2%. We will continue to monitor his progress in the program. Trey Paula continues to do well in the program. He has recently been able to increase his incline on the treadmill from 2% to 3%. He has also increased his elliptical speed from 4.5 mph to 6.2 mph. We will continue to monitor his progress in the program. Reviewed home  exercise with pt today.  Pt plans to add 2 additional days at home on his treadmill for exercise.  Reviewed THR, pulse, RPE, sign and symptoms, pulse oximetery and when to call 911 or MD.  Also discussed weather considerations and indoor options.  Pt voiced understanding.   Expected Outcomes Short: Use RPE daily to regulate intensity. Long: Follow program prescription in THR. Short: Continue to follow current exercise prescription, and progressively increase workloads. Long: Continue exercise to improve strength and stamina. Short: Continue to follow current exercise prescription, and progressively increase workloads. Long: Continue exercise to improve strength and stamina. Short: Continue to follow current exercise prescription, and progressively increase workloads. Long: Continue exercise to improve strength and stamina. Short: Add 2 additional days of exercise independently Long: Continue to exercise independently    Row Name 04/06/23 1543 04/20/23 1131 04/26/23 0938 05/05/23 1737       Exercise Goal Re-Evaluation   Exercise Goals Review Increase Physical Activity;Increase Strength and Stamina;Understanding of Exercise Prescription Increase Physical Activity;Increase Strength and Stamina;Understanding of Exercise Prescription Increase Physical Activity;Increase Strength and Stamina;Understanding of Exercise Prescription Increase Physical Activity;Increase Strength and Stamina;Understanding of Exercise Prescription    Comments Trey Paula continues to do well in rehab. He increased his treadmill workload by improving his incline to 4% while maintaining a speed of 4 mph. He also improved to level 6 on the elliptical at a speed of 6.2  mph. He has stayed consistent with 10 lb hand weights for resistance training as well. We will continue to monitor his progress in the program. Trey Paula continues to make improvements in the program. He has been able to increase his incline on the treadmill from 4% to 6%, while  maintaining his speed. He has also increased his level from 6 to 8 on the elliptical. We will continue to monitor his progress in the program. He continues to do well at rehab on treadmill at 4% incline more a bit before moving up to 6%. He has done well with the eptical doing 6speed and level 12. He lies to do treamill in the morning before work 30-73mins before work when he is at home. currently he isnt doing this, but plans to start back up again soon. Trey Paula is doing well in rehab. He has been able to increase his level on the elliptical from level 8 to level 12. He has also been able to maintain a speed of and an incline of 4% on the treadmill. We will continue to monitor his progress in the program.    Expected Outcomes Short: Try 12 lb hand weights for resistance training. Long: Continue exercise to improve strength and stamina. Short: Try 12 lb hand weights for resistance training. Long: Continue exercise to improve strength and stamina. Short: clean up room and prepare to use treamill again. Long: continue to exercise at home and imporve strength and stamina Short: Continue to follow current exercise prescription. Long: Continue exercise to improve strength and stamina.             Discharge Exercise Prescription (Final Exercise Prescription Changes):  Exercise Prescription Changes - 05/05/23 1700       Response to Exercise   Blood Pressure (Admit) 112/64    Blood Pressure (Exit) 94/60    Heart Rate (Admit) 70 bpm    Heart Rate (Exercise) 128 bpm    Heart Rate (Exit) 88 bpm    Oxygen Saturation (Admit) 96 %    Oxygen Saturation (Exercise) 94 %    Oxygen Saturation (Exit) 95 %    Rating of Perceived Exertion (Exercise) 13    Perceived Dyspnea (Exercise) 0    Symptoms none    Duration Continue with 30 min of aerobic exercise without signs/symptoms of physical distress.    Intensity THRR unchanged      Progression   Progression Continue to progress workloads to maintain  intensity without signs/symptoms of physical distress.    Average METs 6      Resistance Training   Training Prescription Yes    Weight 10 lb    Reps 10-15      Interval Training   Interval Training No      Treadmill   MPH 4    Grade 4    Minutes 15    METs 6.27      Elliptical   Level 12    Speed 5.8    Minutes 15      Home Exercise Plan   Plans to continue exercise at Home (comment)   Treadmill at home   Frequency Add 2 additional days to program exercise sessions.    Initial Home Exercises Provided 03/29/23      Oxygen   Maintain Oxygen Saturation 88% or higher             Nutrition:  Target Goals: Understanding of nutrition guidelines, daily intake of sodium 1500mg , cholesterol 200mg , calories 30% from fat and  7% or less from saturated fats, daily to have 5 or more servings of fruits and vegetables.  Education: All About Nutrition: -Group instruction provided by verbal, written material, interactive activities, discussions, models, and posters to present general guidelines for heart healthy nutrition including fat, fiber, MyPlate, the role of sodium in heart healthy nutrition, utilization of the nutrition label, and utilization of this knowledge for meal planning. Follow up email sent as well. Written material given at graduation. Flowsheet Row Cardiac Rehab from 05/05/2023 in [facility] Cardiac and Pulmonary Rehab  Date 04/07/23  Educator JG  Instruction Review Code 1- Verbalizes Understanding       Biometrics:  Pre Biometrics - 02/07/23 1640       Pre Biometrics   Height 5\' 8"  (1.727 m)    Weight 187 lb 9.6 oz (85.1 kg)    Waist Circumference 39.5 inches    Hip Circumference 38.2 inches    Waist to Hip Ratio 1.03 %    BMI (Calculated) 28.53    Single Leg Stand 30 seconds             Post Biometrics - 05/03/23 0947        Post  Biometrics   Height 5\' 8"  (1.727 m)    Weight 186 lb (84.4 kg)    Waist Circumference 38.5 inches    Hip  Circumference 37 inches    Waist to Hip Ratio 1.04 %    BMI (Calculated) 28.29    Single Leg Stand 30 seconds             Nutrition Therapy Plan and Nutrition Goals:  Nutrition Therapy & Goals - 04/26/23 0934       Nutrition Therapy   Diet Cardiac, Low Na    Protein (specify units) 90    Fiber 30 grams    Whole Grain Foods 3 servings    Saturated Fats 15 max. grams    Fruits and Vegetables 5 servings/day    Sodium 2 grams      Personal Nutrition Goals   Nutrition Goal Eat 15-30gProtein and 30-60gCarbs at each meal.    Personal Goal #2 Include more colorful produce, aim for 5-8 servings of fruits and veggies per day    Personal Goal #3 Reduce saturated fat, less than 12g per day. Replace bad fats for more heart healthy fats.    Comments Patient drinking plenty of water daily. Gave up alcohol since heart attack. He has cut back on butter and red meat since then as well. Reviewed his 24hr food recall and discussed pairing protein with carbs to creat a more balanced meal. Went over [employer] handout, educated on types of fats, sources, and how to read label. Spoke about cutting back on sodium and saturated fat. Commended him on several of the changes he has already made. Built out several meals and snacks with foods he likes focusing on making protein rich meals with controlled portions of carbs keeping saturated fat and sodium down.      Intervention Plan   Intervention Prescribe, educate and counsel regarding individualized specific dietary modifications aiming towards targeted core components such as weight, hypertension, lipid management, diabetes, heart failure and other comorbidities.;Nutrition handout(s) given to patient.    Expected Outcomes Short Term Goal: Understand basic principles of dietary content, such as calories, fat, sodium, cholesterol and nutrients.;Long Term Goal: Adherence to prescribed nutrition plan.;Short Term Goal: A plan has been developed with  personal nutrition goals set during dietitian appointment.  Nutrition Assessments:  MEDIFICTS Score Key: >=70 Need to make dietary changes  40-70 Heart Healthy Diet <= 40 Therapeutic Level Cholesterol Diet  Flowsheet Row Cardiac Rehab from 05/05/2023 in [facility] Pulmonary Re[facility]  Picture Your Plate Total Score on Discharge 75      Picture Your Plate Scores: <16 Unhealthy dietary pattern with much room for improvement. 41-50 Dietary pattern unlikely to meet recommendations for good health and room for improvement. 51-60 More healthful dietary pattern, with some room for improvement.  >60 Healthy dietary pattern, although there may be some specific behaviors that could be improved.    Nutrition Goals Re-Evaluation:  Nutrition Goals Re-Evaluation     Row Name 04/12/23 0947             Goals   Nutrition Goal Trey Paula recently scheduled an appt to meet with RD. He states that he feels he is doing good with his nutrition as he has made some changes recently. He is looking forward to the RD appointment as he would like to learn more.       Comment Short: Meet with RD. Long: Practice heart healthy eating patterns discussed with RD.                Nutrition Goals Discharge (Final Nutrition Goals Re-Evaluation):  Nutrition Goals Re-Evaluation - 04/12/23 0947       Goals   Nutrition Goal Trey Paula recently scheduled an appt to meet with RD. He states that he feels he is doing good with his nutrition as he has made some changes recently. He is looking forward to the RD appointment as he would like to learn more.    Comment Short: Meet with RD. Long: Practice heart healthy eating patterns discussed with RD.             Psychosocial: Target Goals: Acknowledge presence or absence of significant depression and/or stress, maximize coping skills, provide positive support system. Participant is able to verbalize types and ability to use techniques and skills needed  for reducing stress and depression.   Education: Stress, Anxiety, and Depression - Group verbal and visual presentation to define topics covered.  Reviews how body is impacted by stress, anxiety, and depression.  Also discusses healthy ways to reduce stress and to treat/manage anxiety and depression.  Written material given at graduation.   Education: Sleep Hygiene -Provides group verbal and written instruction about how sleep can affect your health.  Define sleep hygiene, discuss sleep cycles and impact of sleep habits. Review good sleep hygiene tips.    Initial Review & Psychosocial Screening:  Initial Psych Review & Screening - 02/03/23 1341       Initial Review   Current issues with Current Stress Concerns    Comments healing from MI      Family Dynamics   Good Support System? Yes   family     Barriers   Psychosocial barriers to participate in program There are no identifiable barriers or psychosocial needs.;The patient should benefit from training in stress management and relaxation.      Screening Interventions   Interventions Encouraged to exercise;Provide feedback about the scores to participant;To provide support and resources with identified psychosocial needs    Expected Outcomes Short Term goal: Utilizing psychosocial counselor, staff and physician to assist with identification of specific Stressors or current issues interfering with healing process. Setting desired goal for each stressor or current issue identified.;Long Term Goal: Stressors or current issues are controlled or eliminated.;Short Term goal: Identification  and review with participant of any Quality of Life or Depression concerns found by scoring the questionnaire.;Long Term goal: The participant improves quality of Life and PHQ9 Scores as seen by post scores and/or verbalization of changes             Quality of Life Scores:   Quality of Life - 05/05/23 0928       Quality of Life Scores    Health/Function Pre 27.83 %    Health/Function Post 29.03 %    Health/Function % Change 4.31 %    Socioeconomic Pre 28.13 %    Socioeconomic Post 29.64 %    Socioeconomic % Change  5.37 %    Psych/Spiritual Pre 27.21 %    Psych/Spiritual Post 29.64 %    Psych/Spiritual % Change 8.93 %    Family Pre 27 %    Family Post 30 %    Family % Change 11.11 %    GLOBAL Pre 27.66 %    GLOBAL Post 29.41 %    GLOBAL % Change 6.33 %            Scores of 19 and below usually indicate a poorer quality of life in these areas.  A difference of  2-3 points is a clinically meaningful difference.  A difference of 2-3 points in the total score of the Quality of Life Index has been associated with significant improvement in overall quality of life, self-image, physical symptoms, and general health in studies assessing change in quality of life.  PHQ-9: Review Flowsheet       05/05/2023 02/07/2023 02/02/2023 06/24/2020  Depression screen PHQ 2/9  Decreased Interest 0 0 0 0  Down, Depressed, Hopeless 0 0 0 0  PHQ - 2 Score 0 0 0 0  Altered sleeping 0 0 - -  Tired, decreased energy 0 0 - -  Change in appetite 0 0 - -  Feeling bad or failure about yourself  0 0 - -  Trouble concentrating 0 0 - -  Moving slowly or fidgety/restless 0 0 - -  Suicidal thoughts 0 0 - -  PHQ-9 Score 0 0 - -   Interpretation of Total Score  Total Score Depression Severity:  1-4 = Minimal depression, 5-9 = Mild depression, 10-14 = Moderate depression, 15-19 = Moderately severe depression, 20-27 = Severe depression   Psychosocial Evaluation and Intervention:  Psychosocial Evaluation - 02/03/23 1349       Psychosocial Evaluation & Interventions   Comments Mr. Trayer is coming to cardiac rehab after a type 2 MI/ SCAD. This event came as a surprise to him and has been stressful learning how to recover from. He lives a very active lifestyle, including exercising regularly, working on cars, water sports, and yard work. He plans  on returning to work next week by easing into working from home some and then the office more. He has a very supportive family. He wants to work on his stamina and education related to living  heart healthy lifestyle. He is ready to get back to his usual exercise routine and is grateful for the heart monitoring in the program.    Expected Outcomes Short: attend cardiac rehab for education and exercise. Long; Develop and maitnain positive self care habits.    Continue Psychosocial Services  Follow up required by staff             Psychosocial Re-Evaluation:  Psychosocial Re-Evaluation     Row Name 04/12/23 [phone] 04/26/23 [phone]  Psychosocial Re-Evaluation   Current issues with Current Stress Concerns;Current Sleep Concerns --      Comments Trey Paula states that he has no major stressors at this time but does have a little stress from work. He also states that his wife and close friends are good support. He also reports no issues with sleep at this time. He states he is not exepericing any stress anxiety or depression. He has good support system with frineds and family. He reports he is sleeping well most nights. is seeing a Dr about anti snore guard to help him sleep better.      Expected Outcomes Short: Continue to exercise for stress relief. Long: Continue to maintain positive outlook. Short: Continue to exercis eand look to improve sleep. long: maintain postive outlook and sleep well      Interventions Encouraged to attend Cardiac Rehabilitation for the exercise Encouraged to attend Cardiac Rehabilitation for the exercise      Continue Psychosocial Services  Follow up required by staff Follow up required by staff               Psychosocial Discharge (Final Psychosocial Re-Evaluation):  Psychosocial Re-Evaluation - 04/26/23 0943       Psychosocial Re-Evaluation   Comments He states he is not exepericing any stress anxiety or depression. He has good support system with frineds  and family. He reports he is sleeping well most nights. is seeing a Dr about anti snore guard to help him sleep better.    Expected Outcomes Short: Continue to exercis eand look to improve sleep. long: maintain postive outlook and sleep well    Interventions Encouraged to attend Cardiac Rehabilitation for the exercise    Continue Psychosocial Services  Follow up required by staff             Vocational Rehabilitation: Provide vocational rehab assistance to qualifying candidates.   Vocational Rehab Evaluation & Intervention:  Vocational Rehab - 02/07/23 1644       Initial Vocational Rehab Evaluation & Intervention   Assessment shows need for Vocational Rehabilitation No             Education: Education Goals: Education classes will be provided on a variety of topics geared toward better understanding of heart health and risk factor modification. Participant will state understanding/return demonstration of topics presented as noted by education test scores.  Learning Barriers/Preferences:  Learning Barriers/Preferences - 02/03/23 1340       Learning Barriers/Preferences   Learning Barriers None    Learning Preferences None             General Cardiac Education Topics:  AED/CPR: - Group verbal and written instruction with the use of models to demonstrate the basic use of the AED with the basic ABC's of resuscitation.   Anatomy and Cardiac Procedures: - Group verbal and visual presentation and models provide information about basic cardiac anatomy and function. Reviews the testing methods done to diagnose heart disease and the outcomes of the test results. Describes the treatment choices: Medical Management, Angioplasty, or Coronary Bypass Surgery for treating various heart conditions including Myocardial Infarction, Angina, Valve Disease, and Cardiac Arrhythmias.  Written material given at graduation. Flowsheet Row Cardiac Rehab from 05/05/2023 in [facility] Cardiac and  Pulmonary Rehab  Education need identified 02/07/23  Date 04/14/23  Educator SB  Instruction Review Code 1- Verbalizes Understanding       Medication Safety: - Group verbal and visual instruction to review commonly prescribed medications for heart and lung  disease. Reviews the medication, class of the drug, and side effects. Includes the steps to properly store meds and maintain the prescription regimen.  Written material given at graduation. Flowsheet Row Cardiac Rehab from 05/05/2023 in [facility] Cardiac and Pulmonary Rehab  Date 04/19/23  Educator [place]  Instruction Review Code 1- Verbalizes Understanding       Intimacy: - Group verbal instruction through game format to discuss how heart and lung disease can affect sexual intimacy. Written material given at graduation.. Flowsheet Row Cardiac Rehab from 05/05/2023 in [facility] Cardiac and Pulmonary Rehab  Date 03/24/23  Educator MB  Instruction Review Code 1- Verbalizes Understanding       Know Your Numbers and Heart Failure: - Group verbal and visual instruction to discuss disease risk factors for cardiac and pulmonary disease and treatment options.  Reviews associated critical values for Overweight/Obesity, Hypertension, Cholesterol, and Diabetes.  Discusses basics of heart failure: signs/symptoms and treatments.  Introduces Heart Failure Zone chart for action plan for heart failure.  Written material given at graduation. Flowsheet Row Cardiac Rehab from 05/05/2023 in [facility] Cardiac and Pulmonary Rehab  Date 04/28/23  Educator SB  Instruction Review Code 1- Verbalizes Understanding       Infection Prevention: - Provides verbal and written material to individual with discussion of infection control including proper hand washing and proper equipment cleaning during exercise session. Flowsheet Row Cardiac Rehab from 05/05/2023 in [facility] Cardiac and Pulmonary Rehab  Date 02/07/23  Educator MB  Instruction Review Code 1- Verbalizes  Understanding       Falls Prevention: - Provides verbal and written material to individual with discussion of falls prevention and safety. Flowsheet Row Cardiac Rehab from 05/05/2023 in [facility] Cardiac and Pulmonary Rehab  Date 02/07/23  Educator MB  Instruction Review Code 1- Verbalizes Understanding       Other: -Provides group and verbal instruction on various topics (see comments)   Knowledge Questionnaire Score:  Knowledge Questionnaire Score - 05/05/23 0925       Knowledge Questionnaire Score   Pre Score 25/26    Post Score 26/26             Core Components/Risk Factors/Patient Goals at Admission:  Personal Goals and Risk Factors at Admission - 02/07/23 1645       Core Components/Risk Factors/Patient Goals on Admission    Weight Management Yes;Weight Loss    Intervention Weight Management: Develop a combined nutrition and exercise program designed to reach desired caloric intake, while maintaining appropriate intake of nutrient and fiber, sodium and fats, and appropriate energy expenditure required for the weight goal.;Weight Management: Provide education and appropriate resources to help participant work on and attain dietary goals.;Weight Management/Obesity: Establish reasonable short term and long term weight goals.    Admit Weight 187 lb 9.6 oz (85.1 kg)    Goal Weight: Short Term 182 lb 9.6 oz (82.8 kg)    Goal Weight: Long Term 177 lb 9.6 oz (80.6 kg)    Expected Outcomes Short Term: Continue to assess and modify interventions until short term weight is achieved;Long Term: Adherence to nutrition and physical activity/exercise program aimed toward attainment of established weight goal;Weight Loss: Understanding of general recommendations for a balanced deficit meal plan, which promotes 1-2 lb weight loss per week and includes a negative energy balance of [phone] kcal/d;Understanding recommendations for meals to include 15-35% energy as protein, 25-35% energy from  fat, 35-60% energy from carbohydrates, less than 200mg  of dietary cholesterol, 20-35 gm of total fiber daily;Understanding of  distribution of calorie intake throughout the day with the consumption of 4-5 meals/snacks    Hypertension Yes    Intervention Monitor prescription use compliance.;Provide education on lifestyle modifcations including regular physical activity/exercise, weight management, moderate sodium restriction and increased consumption of fresh fruit, vegetables, and low fat dairy, alcohol moderation, and smoking cessation.    Expected Outcomes Short Term: Continued assessment and intervention until BP is < 140/7mm HG in hypertensive participants. < 130/56mm HG in hypertensive participants with diabetes, heart failure or chronic kidney disease.;Long Term: Maintenance of blood pressure at goal levels.    Lipids Yes    Intervention Provide education and support for participant on nutrition & aerobic/resistive exercise along with prescribed medications to achieve LDL 70mg , HDL >40mg .    Expected Outcomes Short Term: Participant states understanding of desired cholesterol values and is compliant with medications prescribed. Participant is following exercise prescription and nutrition guidelines.;Long Term: Cholesterol controlled with medications as prescribed, with individualized exercise RX and with personalized nutrition plan. Value goals: LDL < 70mg , HDL > 40 mg.             Education:Diabetes - Individual verbal and written instruction to review signs/symptoms of diabetes, desired ranges of glucose level fasting, after meals and with exercise. Acknowledge that pre and post exercise glucose checks will be done for 3 sessions at entry of program.   Core Components/Risk Factors/Patient Goals Review:   Goals and Risk Factor Review     Row Name 04/12/23 0948 04/26/23 0949           Core Components/Risk Factors/Patient Goals Review   Personal Goals Review Weight  Management/Obesity;Hypertension Weight Management/Obesity;Hypertension      Review Trey Paula weighed in today at 185.8. He states that he's not unhappy with his weight but would like to lose about 10-15 lbs and then build some muscle. He also reports that he checks his BP at home regularly. He states that his metoprolol has kept his BP lower, but it has been consistent at home and in rehab. He also continues to take all medications as prescribed. He is not salting foods and looking at facts label to avoid excess sodium intake. He would like to lose 10-15lbs. Spoke with him about including more veggies at larger meals. Encouraged him to continue to avoid alcohol and sugary beverages.Getting back to regular exercise here at rehab and at home will also hlep him in his weight loss journey.      Expected Outcomes Short: Continue to work towards weight goal through diet and exercise. Long: Continue to monitor lifestyle risk factors. Short: get back in to regular exercise at home, watch sodium intake. Long: [place] to monitor lifestyle risk factors and build healthy habits to maintain a heart healthy diet               Core Components/Risk Factors/Patient Goals at Discharge (Final Review):   Goals and Risk Factor Review - 04/26/23 0949       Core Components/Risk Factors/Patient Goals Review   Personal Goals Review Weight Management/Obesity;Hypertension    Review He is not salting foods and looking at facts label to avoid excess sodium intake. He would like to lose 10-15lbs. Spoke with him about including more veggies at larger meals. Encouraged him to continue to avoid alcohol and sugary beverages.Getting back to regular exercise here at rehab and at home will also hlep him in his weight loss journey.    Expected Outcomes Short: get back in to regular exercise at home, watch sodium  intake. Long: [place]ine to monitor lifestyle risk factors and build healthy habits to maintain a heart healthy diet              ITP Comments:  ITP Comments     Row Name 02/03/23 1339 02/07/23 1633 02/15/23 1112 02/23/23 0959 03/23/23 0840   ITP Comments Initial phone call completed. Diagnosis can be found in [facility] 9/1. EP Orientation scheduled for Monday, 9/16 at 2:30 pm. Completed and gym orientation. Initial ITP created and sent for review to Dr. Bethann Punches, Medical Director. First full day of exercise!  Patient was oriented to gym and equipment including functions, settings, policies, and procedures.  Patient's individual exercise prescription and treatment plan were reviewed.  All starting workloads were established based on the results of the 6 minute walk test done at initial orientation visit.  The plan for exercise progression was also introduced and progression will be customized based on patient's performance and goals. 30 Day review completed. Medical Director ITP review done, changes made as directed, and signed approval by Medical Director.   new to program 30 Day review completed. Medical Director ITP review done, changes made as directed, and signed approval by Medical Director.    Row Name 04/13/23 1130 05/11/23 0947         ITP Comments 30 Day review completed. Medical Director ITP review done, changes made as directed, and signed approval by Medical Director. 30 Day review completed. Medical Director ITP review done, changes made as directed, and signed approval by Medical Director.               Comments:

## 2023-05-12 ENCOUNTER — Encounter: Payer: BC Managed Care – PPO | Admitting: *Deleted

## 2023-05-12 DIAGNOSIS — Z48812 Encounter for surgical aftercare following surgery on the circulatory system: Secondary | ICD-10-CM | POA: Diagnosis not present

## 2023-05-12 DIAGNOSIS — I21A1 Myocardial infarction type 2: Secondary | ICD-10-CM

## 2023-05-12 DIAGNOSIS — I252 Old myocardial infarction: Secondary | ICD-10-CM | POA: Diagnosis not present

## 2023-05-12 DIAGNOSIS — G4733 Obstructive sleep apnea (adult) (pediatric): Secondary | ICD-10-CM | POA: Diagnosis not present

## 2023-05-12 NOTE — Progress Notes (Signed)
Cardiac Individual Treatment Plan  Patient Details  Name: Nicholas Arnold MRN: [id number] Date of Birth: 12/20/1965 Referring Provider:   Flowsheet Row Cardiac Rehab from 02/07/2023 in [facility] Cardiac and Pulmonary Rehab  Referring Provider Julien Nordmann       Initial Encounter Date:  Flowsheet Row Cardiac Rehab from 02/07/2023 in [facility] Cardiac and Pulmonary Rehab  Date 02/07/23       Visit Diagnosis: Type 2 MI (myocardial infarction) (HCC)  Patient's Home Medications on Admission:  Current Outpatient Medications:    ascorbic acid (VITAMIN C) 250 MG CHEW, Chew 2 tablets by mouth daily., Disp: , Rfl:    aspirin EC 81 MG tablet, Take 81 mg by mouth daily. Swallow whole., Disp: , Rfl:    atorvastatin (LIPITOR) 80 MG tablet, Take 1 tablet (80 mg total) by mouth daily., Disp: 90 tablet, Rfl: 3   Cholecalciferol 10 MCG (400 UNIT) CHEW, Chew 2 tablets by mouth daily., Disp: , Rfl:    metoprolol succinate (TOPROL-XL) 25 MG 24 hr tablet, Take 1 tablet (25 mg total) by mouth daily. Take with or immediately following a meal., Disp: 90 tablet, Rfl: 3  Past Medical History: Past Medical History:  Diagnosis Date   Chickenpox    GERD (gastroesophageal reflux disease)    Hyperlipemia    Neck mass    removed in 2012, benign   Prediabetes    Pyloric stenosis     Tobacco Use: Social History   Tobacco Use  Smoking Status Never  Smokeless Tobacco Never    Labs: Review Flowsheet  More data exists      Latest Ref Rng & Units 06/13/2015 08/13/2016 02/08/2017 06/24/2020 04/05/2023  Labs for ITP Cardiac and Pulmonary Rehab  Cholestrol 0 - 200 mg/dL 962  952  841  324  401   LDL (calc) 0 - 99 mg/dL 027  253  664  403  63   HDL-C >39.00 mg/dL 47.42  59.56  38.75  64.33  38.30   Trlycerides 0.0 - 149.0 mg/dL 29.5  188.4  166.0  63.0  64.0   Hemoglobin A1c 4.6 - 6.5 % - 5.8  5.6  5.7  -     Exercise Target Goals: Exercise Program Goal: Individual exercise prescription set using results from  initial 6 min walk test and THRR while considering  patient's activity barriers and safety.   Exercise Prescription Goal: Initial exercise prescription builds to 30-45 minutes a day of aerobic activity, 2-3 days per week.  Home exercise guidelines will be given to patient during program as part of exercise prescription that the participant will acknowledge.   Education: Aerobic Exercise: - Group verbal and visual presentation on the components of exercise prescription. Introduces F.I.T.T principle from ACSM for exercise prescriptions.  Reviews F.I.T.T. principles of aerobic exercise including progression. Written material given at graduation. Flowsheet Row Cardiac Rehab from 05/05/2023 in [facility] Cardiac and Pulmonary Rehab  Date 03/24/23  Educator MB  Instruction Review Code 1- Verbalizes Understanding       Education: Resistance Exercise: - Group verbal and visual presentation on the components of exercise prescription. Introduces F.I.T.T principle from ACSM for exercise prescriptions  Reviews F.I.T.T. principles of resistance exercise including progression. Written material given at graduation. Flowsheet Row Cardiac Rehab from 05/05/2023 in [facility] Cardiac and Pulmonary Rehab  Date 03/17/23  Educator MB  Instruction Review Code 1- [employer] Understanding        Education: Exercise & Equipment Safety: - Individual verbal instruction and demonstration of equipment use  and safety with use of the equipment. Flowsheet Row Cardiac Rehab from 05/05/2023 in [facility] Cardiac and Pulmonary Rehab  Date 02/07/23  Educator MB  Instruction Review Code 1- Verbalizes Understanding       Education: Exercise Physiology & General Exercise Guidelines: - Group verbal and written instruction with models to review the exercise physiology of the cardiovascular system and associated critical values. Provides general exercise guidelines with specific guidelines to those with heart or lung disease.  Flowsheet  Row Cardiac Rehab from 05/05/2023 in [facility] Cardiac and Pulmonary Rehab  Date 03/10/23  Educator MB  Instruction Review Code 1- [employer] Understanding       Education: Flexibility, Balance, Mind/Body Relaxation: - Group verbal and visual presentation with interactive activity on the components of exercise prescription. Introduces F.I.T.T principle from ACSM for exercise prescriptions. Reviews F.I.T.T. principles of flexibility and balance exercise training including progression. Also discusses the mind body connection.  Reviews various relaxation techniques to help reduce and manage stress (i.e. Deep breathing, progressive muscle relaxation, and visualization). Balance handout provided to take home. Written material given at graduation. Flowsheet Row Cardiac Rehab from 05/05/2023 in [facility] Cardiac and Pulmonary Rehab  Date 03/17/23  Educator MB  Instruction Review Code 1- Verbalizes Understanding       Activity Barriers & Risk Stratification:  Activity Barriers & Cardiac Risk Stratification - 02/07/23 1635       Activity Barriers & Cardiac Risk Stratification   Activity Barriers None    Cardiac Risk Stratification Moderate             6 Minute Walk:  6 Minute Walk     Row Name 02/07/23 1633 05/03/23 0940       6 Minute Walk   Phase Initial Discharge    Distance 1890 feet 2030 feet    Distance % Change -- 7.4 %    Distance Feet Change -- 140 ft    Walk Time 6 minutes 6 minutes    # of Rest Breaks 0 0    MPH 3.58 3.84    METS 4.97 4.93    RPE 7 11    Perceived Dyspnea  0 0    VO2 Peak 17.39 17.25    Symptoms No No    Resting HR 67 bpm 67 bpm    Resting BP 92/62 104/62    Resting Oxygen Saturation  98 % 98 %    Exercise Oxygen Saturation  during 6 min walk 99 % 94 %    Max Ex. HR 127 bpm 104 bpm    Max Ex. BP 126/70 --    2 Minute Post BP 112/74 --             Oxygen Initial Assessment:   Oxygen Re-Evaluation:   Oxygen Discharge (Final Oxygen  Re-Evaluation):   Initial Exercise Prescription:  Initial Exercise Prescription - 02/07/23 1600       Date of Initial Exercise RX and Referring Provider   Date 02/07/23    Referring Provider Julien Nordmann      Oxygen   Maintain Oxygen Saturation 88% or higher      Treadmill   MPH 3.8    Grade 0    Minutes 15    METs 3.91      Elliptical   Level 2    Speed 3    Minutes 15    METs 4.97      T5 Nustep   Level 4    SPM 80  Minutes 15    METs 4.97      Prescription Details   Frequency (times per week) 2    Duration Progress to 30 minutes of continuous aerobic without signs/symptoms of physical distress      Intensity   THRR 40-80% of Max Heartrate 106-146    Ratings of Perceived Exertion 11-13    Perceived Dyspnea 0-4      Progression   Progression Continue to progress workloads to maintain intensity without signs/symptoms of physical distress.      Resistance Training   Training Prescription Yes    Weight 10 lb    Reps 10-15             Perform Capillary Blood Glucose checks as needed.  Exercise Prescription Changes:   Exercise Prescription Changes     Row Name 02/07/23 1600 02/24/23 1700 03/10/23 1400 03/24/23 0800 03/29/23 1000     Response to Exercise   Blood Pressure (Admit) 92/62 110/68 118/78 104/70 --   Blood Pressure (Exercise) 126/70 132/68 160/70 148/74 --   Blood Pressure (Exit) 112/74 114/64 124/72 104/54 --   Heart Rate (Admit) 67 bpm 65 bpm 66 bpm 68 bpm --   Heart Rate (Exercise) 127 bpm 132 bpm 149 bpm 138 bpm --   Heart Rate (Exit) 68 bpm 86 bpm 81 bpm 95 bpm --   Oxygen Saturation (Admit) 98 % -- -- -- --   Oxygen Saturation (Exercise) 99 % -- -- -- --   Oxygen Saturation (Exit) 99 % -- -- -- --   Rating of Perceived Exertion (Exercise) 7 12 13 13  --   Perceived Dyspnea (Exercise) 0 0 0 0 --   Symptoms none none none none --   Comments results -- -- -- --   Duration Progress to 30 minutes of  aerobic without  signs/symptoms of physical distress Progress to 30 minutes of  aerobic without signs/symptoms of physical distress Progress to 30 minutes of  aerobic without signs/symptoms of physical distress Progress to 30 minutes of  aerobic without signs/symptoms of physical distress --   Intensity THRR New THRR unchanged THRR unchanged THRR unchanged --     Progression   Progression Continue to progress workloads to maintain intensity without signs/symptoms of physical distress. Continue to progress workloads to maintain intensity without signs/symptoms of physical distress. Continue to progress workloads to maintain intensity without signs/symptoms of physical distress. Continue to progress workloads to maintain intensity without signs/symptoms of physical distress. --   Average METs 4.97 3.3 4.6 5.62 --     Resistance Training   Training Prescription -- Yes Yes Yes --   Weight -- 10 lb 10 lb 10 lb --   Reps -- 10-15 10-15 10-15 --     Interval Training   Interval Training -- No No No --     Oxygen   Oxygen -- Continuous -- -- --     Treadmill   MPH -- 3.9 4 4  --   Grade -- 0 2 3 --   Minutes -- 15 15 15  --   METs -- 3.99 5.17 5.72 --     Elliptical   Level -- 4 5 5  --   Speed -- 3.7 4.5 6.2 --   Minutes -- 15 15 15  --   METs -- -- -- 5.5 --     T5 Nustep   Level -- 4 4 -- --   Minutes -- 15 15 -- --   METs -- 2.3 3.9 -- --  Home Exercise Plan   Plans to continue exercise at -- -- -- -- Home (comment)  Treadmill at home   Frequency -- -- -- -- Add 2 additional days to program exercise sessions.   Initial Home Exercises Provided -- -- -- -- 03/29/23     Oxygen   Maintain Oxygen Saturation -- 88% or higher 88% or higher 88% or higher --    Row Name 04/06/23 1500 04/20/23 1100 05/05/23 1700         Response to Exercise   Blood Pressure (Admit) 98/62 102/62 112/64     Blood Pressure (Exercise) 144/58 -- --     Blood Pressure (Exit) 102/58 102/60 94/60     Heart Rate (Admit) 70  bpm 73 bpm 70 bpm     Heart Rate (Exercise) 133 bpm 148 bpm 128 bpm     Heart Rate (Exit) 89 bpm 86 bpm 88 bpm     Oxygen Saturation (Admit) -- 98 % 96 %     Oxygen Saturation (Exercise) -- 94 % 94 %     Oxygen Saturation (Exit) -- 95 % 95 %     Rating of Perceived Exertion (Exercise) 14 14 13      Perceived Dyspnea (Exercise) -- -- 0     Symptoms none none none     Duration Continue with 30 min of aerobic exercise without signs/symptoms of physical distress. Continue with 30 min of aerobic exercise without signs/symptoms of physical distress. Continue with 30 min of aerobic exercise without signs/symptoms of physical distress.     Intensity THRR unchanged THRR unchanged THRR unchanged       Progression   Progression Continue to progress workloads to maintain intensity without signs/symptoms of physical distress. Continue to progress workloads to maintain intensity without signs/symptoms of physical distress. Continue to progress workloads to maintain intensity without signs/symptoms of physical distress.     Average METs 6.18 6.86 6       Resistance Training   Training Prescription Yes Yes Yes     Weight 10 lb 10 lb 10 lb     Reps 10-15 10-15 10-15       Interval Training   Interval Training No No No       Treadmill   MPH 4 4 4      Grade 4 6 4      Minutes 15 15 15      METs 6.27 7.37 6.27       Elliptical   Level 6 8 12      Speed 6.2 3.8 5.8     Minutes 15 15 15      METs 6.27 7.1 --       Home Exercise Plan   Plans to continue exercise at Home (comment)  Treadmill at home Home (comment)  Treadmill at home Home (comment)  Treadmill at home     Frequency Add 2 additional days to program exercise sessions. Add 2 additional days to program exercise sessions. Add 2 additional days to program exercise sessions.     Initial Home Exercises Provided 03/29/23 03/29/23 03/29/23       Oxygen   Maintain Oxygen Saturation 88% or higher 88% or higher 88% or higher               Exercise Comments:   Exercise Comments     Row Name 02/15/23 1112           Exercise Comments First full day of exercise!  Patient was oriented to gym and  equipment including functions, settings, policies, and procedures.  Patient's individual exercise prescription and treatment plan were reviewed.  All starting workloads were established based on the results of the 6 minute walk test done at initial orientation visit.  The plan for exercise progression was also introduced and progression will be customized based on patient's performance and goals.                Exercise Goals and Review:   Exercise Goals     Row Name 02/07/23 1640             Exercise Goals   Increase Physical Activity Yes       Intervention Provide advice, education, support and counseling about physical activity/exercise needs.;Develop an individualized exercise prescription for aerobic and resistive training based on initial evaluation findings, risk stratification, comorbidities and participant's personal goals.       Expected Outcomes Short Term: Attend rehab on a regular basis to increase amount of physical activity.;Long Term: Exercising regularly at least 3-5 days a week.;Long Term: Add in home exercise to make exercise part of routine and to increase amount of physical activity.       Increase Strength and Stamina Yes       Intervention Provide advice, education, support and counseling about physical activity/exercise needs.;Develop an individualized exercise prescription for aerobic and resistive training based on initial evaluation findings, risk stratification, comorbidities and participant's personal goals.       Expected Outcomes Short Term: Increase workloads from initial exercise prescription for resistance, speed, and METs.;Short Term: Perform resistance training exercises routinely during rehab and add in resistance training at home;Long Term: Improve cardiorespiratory fitness, muscular  endurance and strength as measured by increased METs and functional capacity ( )       Able to understand and use rate of perceived exertion (RPE) scale Yes       Intervention Provide education and explanation on how to use RPE scale       Expected Outcomes Short Term: Able to use RPE daily in rehab to express subjective intensity level;Long Term:  Able to use RPE to guide intensity level when exercising independently       Able to understand and use Dyspnea scale Yes       Intervention Provide education and explanation on how to use Dyspnea scale       Expected Outcomes Short Term: Able to use Dyspnea scale daily in rehab to express subjective sense of shortness of breath during exertion;Long Term: Able to use Dyspnea scale to guide intensity level when exercising independently       Knowledge and understanding of Target Heart Rate Range (THRR) Yes       Intervention Provide education and explanation of THRR including how the numbers were predicted and where they are located for reference       Expected Outcomes Short Term: Able to state/look up THRR;Long Term: Able to use THRR to govern intensity when exercising independently;Short Term: Able to use daily as guideline for intensity in rehab       Able to check pulse independently Yes       Intervention Provide education and demonstration on how to check pulse in carotid and radial arteries.;Review the importance of being able to check your own pulse for safety during independent exercise       Expected Outcomes Long Term: Able to check pulse independently and accurately;Short Term: Able to explain why pulse checking is important during independent exercise  Understanding of Exercise Prescription Yes       Intervention Provide education, explanation, and written materials on patient's individual exercise prescription       Expected Outcomes Short Term: Able to explain program exercise prescription;Long Term: Able to explain home exercise  prescription to exercise independently                Exercise Goals Re-Evaluation :  Exercise Goals Re-Evaluation     Row Name 02/15/23 1112 02/24/23 1733 03/10/23 1427 03/24/23 0838 03/29/23 1004     Exercise Goal Re-Evaluation   Exercise Goals Review Able to understand and use rate of perceived exertion (RPE) scale;Able to understand and use Dyspnea scale;Knowledge and understanding of Target Heart Rate Range (THRR);Understanding of Exercise Prescription Increase Physical Activity;Increase Strength and Stamina;Understanding of Exercise Prescription Increase Physical Activity;Increase Strength and Stamina;Understanding of Exercise Prescription Increase Physical Activity;Increase Strength and Stamina;Understanding of Exercise Prescription Increase Physical Activity;Able to understand and use Dyspnea scale;Understanding of Exercise Prescription;Increase Strength and Stamina;Knowledge and understanding of Target Heart Rate Range (THRR);Able to check pulse independently;Able to understand and use rate of perceived exertion (RPE) scale   Comments Reviewed RPE and dyspnea scale, THR and program prescription with pt today.  Pt voiced understanding and was given a copy of goals to take home. Nicholas Arnold is off to a great start in the program. He has attended his first 2 sessions during this review. He was able to increase his Treadmill speed from 3. to 3.54mph. We will continue to monitor his progress in the program. Nicholas Arnold continues to do well in the program. He has increased his speed on the elliptical at level 1, from 3. to 4.64mph. He also increased his incline on the treadmill from 0% to 2%. We will continue to monitor his progress in the program. Nicholas Arnold continues to do well in the program. He has recently been able to increase his incline on the treadmill from 2% to 3%. He has also increased his elliptical speed from 4.5 mph to 6.2 mph. We will continue to monitor his progress in the program. Reviewed home  exercise with pt today.  Pt plans to add 2 additional days at home on his treadmill for exercise.  Reviewed THR, pulse, RPE, sign and symptoms, pulse oximetery and when to call 911 or MD.  Also discussed weather considerations and indoor options.  Pt voiced understanding.   Expected Outcomes Short: Use RPE daily to regulate intensity. Long: Follow program prescription in THR. Short: Continue to follow current exercise prescription, and progressively increase workloads. Long: Continue exercise to improve strength and stamina. Short: Continue to follow current exercise prescription, and progressively increase workloads. Long: Continue exercise to improve strength and stamina. Short: Continue to follow current exercise prescription, and progressively increase workloads. Long: Continue exercise to improve strength and stamina. Short: Add 2 additional days of exercise independently Long: Continue to exercise independently    Row Name 04/06/23 1543 04/20/23 1131 04/26/23 0938 05/05/23 1737       Exercise Goal Re-Evaluation   Exercise Goals Review Increase Physical Activity;Increase Strength and Stamina;Understanding of Exercise Prescription Increase Physical Activity;Increase Strength and Stamina;Understanding of Exercise Prescription Increase Physical Activity;Increase Strength and Stamina;Understanding of Exercise Prescription Increase Physical Activity;Increase Strength and Stamina;Understanding of Exercise Prescription    Comments Nicholas Arnold continues to do well in rehab. He increased his treadmill workload by improving his incline to 4% while maintaining a speed of 4 mph. He also improved to level 6 on the elliptical at a speed of 6.2  mph. He has stayed consistent with 10 lb hand weights for resistance training as well. We will continue to monitor his progress in the program. Nicholas Arnold continues to make improvements in the program. He has been able to increase his incline on the treadmill from 4% to 6%, while  maintaining his speed. He has also increased his level from 6 to 8 on the elliptical. We will continue to monitor his progress in the program. He continues to do well at rehab on treadmill at 4% incline more a bit before moving up to 6%. He has done well with the eptical doing 6speed and level 12. He lies to do treamill in the morning before work 30-65mins before work when he is at home. currently he isnt doing this, but plans to start back up again soon. Nicholas Arnold is doing well in rehab. He has been able to increase his level on the elliptical from level 8 to level 12. He has also been able to maintain a speed of and an incline of 4% on the treadmill. We will continue to monitor his progress in the program.    Expected Outcomes Short: Try 12 lb hand weights for resistance training. Long: Continue exercise to improve strength and stamina. Short: Try 12 lb hand weights for resistance training. Long: Continue exercise to improve strength and stamina. Short: clean up room and prepare to use treamill again. Long: continue to exercise at home and imporve strength and stamina Short: Continue to follow current exercise prescription. Long: Continue exercise to improve strength and stamina.             Discharge Exercise Prescription (Final Exercise Prescription Changes):  Exercise Prescription Changes - 05/05/23 1700       Response to Exercise   Blood Pressure (Admit) 112/64    Blood Pressure (Exit) 94/60    Heart Rate (Admit) 70 bpm    Heart Rate (Exercise) 128 bpm    Heart Rate (Exit) 88 bpm    Oxygen Saturation (Admit) 96 %    Oxygen Saturation (Exercise) 94 %    Oxygen Saturation (Exit) 95 %    Rating of Perceived Exertion (Exercise) 13    Perceived Dyspnea (Exercise) 0    Symptoms none    Duration Continue with 30 min of aerobic exercise without signs/symptoms of physical distress.    Intensity THRR unchanged      Progression   Progression Continue to progress workloads to maintain  intensity without signs/symptoms of physical distress.    Average METs 6      Resistance Training   Training Prescription Yes    Weight 10 lb    Reps 10-15      Interval Training   Interval Training No      Treadmill   MPH 4    Grade 4    Minutes 15    METs 6.27      Elliptical   Level 12    Speed 5.8    Minutes 15      Home Exercise Plan   Plans to continue exercise at Home (comment)   Treadmill at home   Frequency Add 2 additional days to program exercise sessions.    Initial Home Exercises Provided 03/29/23      Oxygen   Maintain Oxygen Saturation 88% or higher             Nutrition:  Target Goals: Understanding of nutrition guidelines, daily intake of sodium 1500mg , cholesterol 200mg , calories 30% from fat and  7% or less from saturated fats, daily to have 5 or more servings of fruits and vegetables.  Education: All About Nutrition: -Group instruction provided by verbal, written material, interactive activities, discussions, models, and posters to present general guidelines for heart healthy nutrition including fat, fiber, MyPlate, the role of sodium in heart healthy nutrition, utilization of the nutrition label, and utilization of this knowledge for meal planning. Follow up email sent as well. Written material given at graduation. Flowsheet Row Cardiac Rehab from 05/05/2023 in [facility] Cardiac and Pulmonary Rehab  Date 04/07/23  Educator JG  Instruction Review Code 1- Verbalizes Understanding       Biometrics:  Pre Biometrics - 02/07/23 1640       Pre Biometrics   Height 5\' 8"  (1.727 m)    Weight 187 lb 9.6 oz (85.1 kg)    Waist Circumference 39.5 inches    Hip Circumference 38.2 inches    Waist to Hip Ratio 1.03 %    BMI (Calculated) 28.53    Single Leg Stand 30 seconds             Post Biometrics - 05/03/23 0947        Post  Biometrics   Height 5\' 8"  (1.727 m)    Weight 186 lb (84.4 kg)    Waist Circumference 38.5 inches    Hip  Circumference 37 inches    Waist to Hip Ratio 1.04 %    BMI (Calculated) 28.29    Single Leg Stand 30 seconds             Nutrition Therapy Plan and Nutrition Goals:  Nutrition Therapy & Goals - 04/26/23 0934       Nutrition Therapy   Diet Cardiac, Low Na    Protein (specify units) 90    Fiber 30 grams    Whole Grain Foods 3 servings    Saturated Fats 15 max. grams    Fruits and Vegetables 5 servings/day    Sodium 2 grams      Personal Nutrition Goals   Nutrition Goal Eat 15-30gProtein and 30-60gCarbs at each meal.    Personal Goal #2 Include more colorful produce, aim for 5-8 servings of fruits and veggies per day    Personal Goal #3 Reduce saturated fat, less than 12g per day. Replace bad fats for more heart healthy fats.    Comments Patient drinking plenty of water daily. Gave up alcohol since heart attack. He has cut back on butter and red meat since then as well. Reviewed his 24hr food recall and discussed pairing protein with carbs to creat a more balanced meal. Went over [employer] handout, educated on types of fats, sources, and how to read label. Spoke about cutting back on sodium and saturated fat. Commended him on several of the changes he has already made. Built out several meals and snacks with foods he likes focusing on making protein rich meals with controlled portions of carbs keeping saturated fat and sodium down.      Intervention Plan   Intervention Prescribe, educate and counsel regarding individualized specific dietary modifications aiming towards targeted core components such as weight, hypertension, lipid management, diabetes, heart failure and other comorbidities.;Nutrition handout(s) given to patient.    Expected Outcomes Short Term Goal: Understand basic principles of dietary content, such as calories, fat, sodium, cholesterol and nutrients.;Long Term Goal: Adherence to prescribed nutrition plan.;Short Term Goal: A plan has been developed with  personal nutrition goals set during dietitian appointment.  Nutrition Assessments:  MEDIFICTS Score Key: >=70 Need to make dietary changes  40-70 Heart Healthy Diet <= 40 Therapeutic Level Cholesterol Diet  Flowsheet Row Cardiac Rehab from 05/05/2023 in [facility] Cardiac and Pulmonary Rehab  Picture Your Plate Total Score on Discharge 75      Picture Your Plate Scores: <13 Unhealthy dietary pattern with much room for improvement. 41-50 Dietary pattern unlikely to meet recommendations for good health and room for improvement. 51-60 More healthful dietary pattern, with some room for improvement.  >60 Healthy dietary pattern, although there may be some specific behaviors that could be improved.    Nutrition Goals Re-Evaluation:  Nutrition Goals Re-Evaluation     Row Name 04/12/23 0947             Goals   Nutrition Goal Nicholas Arnold recently scheduled an appt to meet with RD. He states that he feels he is doing good with his nutrition as he has made some changes recently. He is looking forward to the RD appointment as he would like to learn more.       Comment Short: Meet with RD. Long: Practice heart healthy eating patterns discussed with RD.                Nutrition Goals Discharge (Final Nutrition Goals Re-Evaluation):  Nutrition Goals Re-Evaluation - 04/12/23 0947       Goals   Nutrition Goal Nicholas Arnold recently scheduled an appt to meet with RD. He states that he feels he is doing good with his nutrition as he has made some changes recently. He is looking forward to the RD appointment as he would like to learn more.    Comment Short: Meet with RD. Long: Practice heart healthy eating patterns discussed with RD.             Psychosocial: Target Goals: Acknowledge presence or absence of significant depression and/or stress, maximize coping skills, provide positive support system. Participant is able to verbalize types and ability to use techniques and skills needed  for reducing stress and depression.   Education: Stress, Anxiety, and Depression - Group verbal and visual presentation to define topics covered.  Reviews how body is impacted by stress, anxiety, and depression.  Also discusses healthy ways to reduce stress and to treat/manage anxiety and depression.  Written material given at graduation.   Education: Sleep Hygiene -Provides group verbal and written instruction about how sleep can affect your health.  Define sleep hygiene, discuss sleep cycles and impact of sleep habits. Review good sleep hygiene tips.    Initial Review & Psychosocial Screening:  Initial Psych Review & Screening - 02/03/23 1341       Initial Review   Current issues with Current Stress Concerns    Comments healing from MI      Family Dynamics   Good Support System? Yes   family     Barriers   Psychosocial barriers to participate in program There are no identifiable barriers or psychosocial needs.;The patient should benefit from training in stress management and relaxation.      Screening Interventions   Interventions Encouraged to exercise;Provide feedback about the scores to participant;To provide support and resources with identified psychosocial needs    Expected Outcomes Short Term goal: Utilizing psychosocial counselor, staff and physician to assist with identification of specific Stressors or current issues interfering with healing process. Setting desired goal for each stressor or current issue identified.;Long Term Goal: Stressors or current issues are controlled or eliminated.;Short Term goal: Identification  and review with participant of any Quality of Life or Depression concerns found by scoring the questionnaire.;Long Term goal: The participant improves quality of Life and PHQ9 Scores as seen by post scores and/or verbalization of changes             Quality of Life Scores:   Quality of Life - 05/05/23 0928       Quality of Life Scores    Health/Function Pre 27.83 %    Health/Function Post 29.03 %    Health/Function % Change 4.31 %    Socioeconomic Pre 28.13 %    Socioeconomic Post 29.64 %    Socioeconomic % Change  5.37 %    Psych/Spiritual Pre 27.21 %    Psych/Spiritual Post 29.64 %    Psych/Spiritual % Change 8.93 %    Family Pre 27 %    Family Post 30 %    Family % Change 11.11 %    GLOBAL Pre 27.66 %    GLOBAL Post 29.41 %    GLOBAL % Change 6.33 %            Scores of 19 and below usually indicate a poorer quality of life in these areas.  A difference of  2-3 points is a clinically meaningful difference.  A difference of 2-3 points in the total score of the Quality of Life Index has been associated with significant improvement in overall quality of life, self-image, physical symptoms, and general health in studies assessing change in quality of life.  PHQ-9: Review Flowsheet       05/05/2023 02/07/2023 02/02/2023 06/24/2020  Depression screen PHQ 2/9  Decreased Interest 0 0 0 0  Down, Depressed, Hopeless 0 0 0 0  PHQ - 2 Score 0 0 0 0  Altered sleeping 0 0 - -  Tired, decreased energy 0 0 - -  Change in appetite 0 0 - -  Feeling bad or failure about yourself  0 0 - -  Trouble concentrating 0 0 - -  Moving slowly or fidgety/restless 0 0 - -  Suicidal thoughts 0 0 - -  PHQ-9 Score 0 0 - -   Interpretation of Total Score  Total Score Depression Severity:  1-4 = Minimal depression, 5-9 = Mild depression, 10-14 = Moderate depression, 15-19 = Moderately severe depression, 20-27 = Severe depression   Psychosocial Evaluation and Intervention:  Psychosocial Evaluation - 02/03/23 1349       Psychosocial Evaluation & Interventions   Comments Nicholas Arnold is coming to cardiac rehab after a type 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 This event came as a surprise to him and has been stressful learning how to recover from. He lives a very active lifestyle, including exercising regularly, working on cars, water sports, and yard work. He plans  on returning to work next week by easing into working from home some and then the office more. He has a very supportive family. He wants to work on his stamina and education related to living  heart healthy lifestyle. He is ready to get back to his usual exercise routine and is grateful for the heart monitoring in the program.    Expected Outcomes Short: attend cardiac rehab for education and exercise. Long; Develop and maitnain positive self care habits.    Continue Psychosocial Services  Follow up required by staff             Psychosocial Re-Evaluation:  Psychosocial Re-Evaluation     Row Name 04/12/23 [phone] 04/26/23 [phone]  Psychosocial Re-Evaluation   Current issues with Current Stress Concerns;Current Sleep Concerns --      Comments Nicholas Arnold states that he has no major stressors at this time but does have a little stress from work. He also states that his wife and close friends are good support. He also reports no issues with sleep at this time. He states he is not exepericing any stress anxiety or depression. He has good support system with frineds and family. He reports he is sleeping well most nights. is seeing a Dr about anti snore guard to help him sleep better.      Expected Outcomes Short: Continue to exercise for stress relief. Long: Continue to maintain positive outlook. Short: Continue to exercis eand look to improve sleep. long: maintain postive outlook and sleep well      Interventions Encouraged to attend Cardiac Rehabilitation for the exercise Encouraged to attend Cardiac Rehabilitation for the exercise      Continue Psychosocial Services  Follow up required by staff Follow up required by staff               Psychosocial Discharge (Final Psychosocial Re-Evaluation):  Psychosocial Re-Evaluation - 04/26/23 0943       Psychosocial Re-Evaluation   Comments He states he is not exepericing any stress anxiety or depression. He has good support system with frineds  and family. He reports he is sleeping well most nights. is seeing a Dr about anti snore guard to help him sleep better.    Expected Outcomes Short: Continue to exercis eand look to improve sleep. long: maintain postive outlook and sleep well    Interventions Encouraged to attend Cardiac Rehabilitation for the exercise    Continue Psychosocial Services  Follow up required by staff             Vocational Rehabilitation: Provide vocational rehab assistance to qualifying candidates.   Vocational Rehab Evaluation & Intervention:  Vocational Rehab - 02/07/23 1644       Initial Vocational Rehab Evaluation & Intervention   Assessment shows need for Vocational Rehabilitation No             Education: Education Goals: Education classes will be provided on a variety of topics geared toward better understanding of heart health and risk factor modification. Participant will state understanding/return demonstration of topics presented as noted by education test scores.  Learning Barriers/Preferences:  Learning Barriers/Preferences - 02/03/23 1340       Learning Barriers/Preferences   Learning Barriers None    Learning Preferences None             General Cardiac Education Topics:  AED/CPR: - Group verbal and written instruction with the use of models to demonstrate the basic use of the AED with the basic ABC's of resuscitation.   Anatomy and Cardiac Procedures: - Group verbal and visual presentation and models provide information about basic cardiac anatomy and function. Reviews the testing methods done to diagnose heart disease and the outcomes of the test results. Describes the treatment choices: Medical Management, Angioplasty, or Coronary Bypass Surgery for treating various heart conditions including Myocardial Infarction, Angina, Valve Disease, and Cardiac Arrhythmias.  Written material given at graduation. Flowsheet Row Cardiac Rehab from 05/05/2023 in [facility] Cardiac and  Pulmonary Rehab  Education need identified 02/07/23  Date 04/14/23  Educator SB  Instruction Review Code 1- Verbalizes Understanding       Medication Safety: - Group verbal and visual instruction to review commonly prescribed medications for heart and lung  disease. Reviews the medication, class of the drug, and side effects. Includes the steps to properly store meds and maintain the prescription regimen.  Written material given at graduation. Flowsheet Row Cardiac Rehab from 05/05/2023 in [facility] Cardiac and Pulmonary Rehab  Date 04/19/23  Educator [place]  Instruction Review Code 1- Verbalizes Understanding       Intimacy: - Group verbal instruction through game format to discuss how heart and lung disease can affect sexual intimacy. Written material given at graduation.. Flowsheet Row Cardiac Rehab from 05/05/2023 in [facility] Cardiac and Pulmonary Rehab  Date 03/24/23  Educator MB  Instruction Review Code 1- Verbalizes Understanding       Know Your Numbers and Heart Failure: - Group verbal and visual instruction to discuss disease risk factors for cardiac and pulmonary disease and treatment options.  Reviews associated critical values for Overweight/Obesity, Hypertension, Cholesterol, and Diabetes.  Discusses basics of heart failure: signs/symptoms and treatments.  Introduces Heart Failure Zone chart for action plan for heart failure.  Written material given at graduation. Flowsheet Row Cardiac Rehab from 05/05/2023 in [facility] Cardiac and Pulmonary Rehab  Date 04/28/23  Educator SB  Instruction Review Code 1- Verbalizes Understanding       Infection Prevention: - Provides verbal and written material to individual with discussion of infection control including proper hand washing and proper equipment cleaning during exercise session. Flowsheet Row Cardiac Rehab from 05/05/2023 in [facility] Cardiac and Pulmonary Rehab  Date 02/07/23  Educator MB  Instruction Review Code 1- Verbalizes  Understanding       Falls Prevention: - Provides verbal and written material to individual with discussion of falls prevention and safety. Flowsheet Row Cardiac Rehab from 05/05/2023 in [facility] Cardiac and Pulmonary Rehab  Date 02/07/23  Educator MB  Instruction Review Code 1- Verbalizes Understanding       Other: -Provides group and verbal instruction on various topics (see comments)   Knowledge Questionnaire Score:  Knowledge Questionnaire Score - 05/05/23 0925       Knowledge Questionnaire Score   Pre Score 25/26    Post Score 26/26             Core Components/Risk Factors/Patient Goals at Admission:  Personal Goals and Risk Factors at Admission - 02/07/23 1645       Core Components/Risk Factors/Patient Goals on Admission    Weight Management Yes;Weight Loss    Intervention Weight Management: Develop a combined nutrition and exercise program designed to reach desired caloric intake, while maintaining appropriate intake of nutrient and fiber, sodium and fats, and appropriate energy expenditure required for the weight goal.;Weight Management: Provide education and appropriate resources to help participant work on and attain dietary goals.;Weight Management/Obesity: Establish reasonable short term and long term weight goals.    Admit Weight 187 lb 9.6 oz (85.1 kg)    Goal Weight: Short Term 182 lb 9.6 oz (82.8 kg)    Goal Weight: Long Term 177 lb 9.6 oz (80.6 kg)    Expected Outcomes Short Term: Continue to assess and modify interventions until short term weight is achieved;Long Term: Adherence to nutrition and physical activity/exercise program aimed toward attainment of established weight goal;Weight Loss: Understanding of general recommendations for a balanced deficit meal plan, which promotes 1-2 lb weight loss per week and includes a negative energy balance of [phone] kcal/d;Understanding recommendations for meals to include 15-35% energy as protein, 25-35% energy from  fat, 35-60% energy from carbohydrates, less than 200mg  of dietary cholesterol, 20-35 gm of total fiber daily;Understanding of  distribution of calorie intake throughout the day with the consumption of 4-5 meals/snacks    Hypertension Yes    Intervention Monitor prescription use compliance.;Provide education on lifestyle modifcations including regular physical activity/exercise, weight management, moderate sodium restriction and increased consumption of fresh fruit, vegetables, and low fat dairy, alcohol moderation, and smoking cessation.    Expected Outcomes Short Term: Continued assessment and intervention until BP is < 140/52mm HG in hypertensive participants. < 130/2mm HG in hypertensive participants with diabetes, heart failure or chronic kidney disease.;Long Term: Maintenance of blood pressure at goal levels.    Lipids Yes    Intervention Provide education and support for participant on nutrition & aerobic/resistive exercise along with prescribed medications to achieve LDL 70mg , HDL >40mg .    Expected Outcomes Short Term: Participant states understanding of desired cholesterol values and is compliant with medications prescribed. Participant is following exercise prescription and nutrition guidelines.;Long Term: Cholesterol controlled with medications as prescribed, with individualized exercise RX and with personalized nutrition plan. Value goals: LDL < 70mg , HDL > 40 mg.             Education:Diabetes - Individual verbal and written instruction to review signs/symptoms of diabetes, desired ranges of glucose level fasting, after meals and with exercise. Acknowledge that pre and post exercise glucose checks will be done for 3 sessions at entry of program.   Core Components/Risk Factors/Patient Goals Review:   Goals and Risk Factor Review     Row Name 04/12/23 0948 04/26/23 0949           Core Components/Risk Factors/Patient Goals Review   Personal Goals Review Weight  Management/Obesity;Hypertension Weight Management/Obesity;Hypertension      Review Nicholas Arnold weighed in today at 185.8. He states that he's not unhappy with his weight but would like to lose about 10-15 lbs and then build some muscle. He also reports that he checks his BP at home regularly. He states that his metoprolol has kept his BP lower, but it has been consistent at home and in rehab. He also continues to take all medications as prescribed. He is not salting foods and looking at facts label to avoid excess sodium intake. He would like to lose 10-15lbs. Spoke with him about including more veggies at larger meals. Encouraged him to continue to avoid alcohol and sugary beverages.Getting back to regular exercise here at rehab and at home will also hlep him in his weight loss journey.      Expected Outcomes Short: Continue to work towards weight goal through diet and exercise. Long: Continue to monitor lifestyle risk factors. Short: get back in to regular exercise at home, watch sodium intake. Long: Contine to monitor lifestyle risk factors and build healthy habits to maintain a heart healthy diet               Core Components/Risk Factors/Patient Goals at Discharge (Final Review):   Goals and Risk Factor Review - 04/26/23 0949       Core Components/Risk Factors/Patient Goals Review   Personal Goals Review Weight Management/Obesity;Hypertension    Review He is not salting foods and looking at facts label to avoid excess sodium intake. He would like to lose 10-15lbs. Spoke with him about including more veggies at larger meals. Encouraged him to continue to avoid alcohol and sugary beverages.Getting back to regular exercise here at rehab and at home will also hlep him in his weight loss journey.    Expected Outcomes Short: get back in to regular exercise at home, watch sodium  intake. Long: Contine to monitor lifestyle risk factors and build healthy habits to maintain a heart healthy diet              ITP Comments:  ITP Comments     Row Name 02/03/23 1339 02/07/23 1633 02/15/23 1112 02/23/23 0959 03/23/23 0840   ITP Comments Initial phone call completed. Diagnosis can be found in [facility] 9/1. EP Orientation scheduled for Monday, 9/16 at 2:30 pm. Completed and gym orientation. Initial ITP created and sent for review to Dr. Bethann Punches, Medical Director. First full day of exercise!  Patient was oriented to gym and equipment including functions, settings, policies, and procedures.  Patient's individual exercise prescription and treatment plan were reviewed.  All starting workloads were established based on the results of the 6 minute walk test done at initial orientation visit.  The plan for exercise progression was also introduced and progression will be customized based on patient's performance and goals. 30 Day review completed. Medical Director ITP review done, changes made as directed, and signed approval by Medical Director.   new to program 30 Day review completed. Medical Director ITP review done, changes made as directed, and signed approval by Medical Director.    Row Name 04/13/23 1130 05/11/23 0947 05/12/23 0922       ITP Comments 30 Day review completed. Medical Director ITP review done, changes made as directed, and signed approval by Medical Director. 30 Day review completed. Medical Director ITP review done, changes made as directed, and signed approval by Medical Director. Nicholas Arnold graduated today from  rehab with 36 sessions completed.  Details of the patient's exercise prescription and what He needs to do in order to continue the prescription and progress were discussed with patient.  Patient was given a copy of prescription and goals.  Patient verbalized understanding. Nicholas Arnold plans to continue to exercise by treadmill at home.              Comments: Discharge ITP

## 2023-05-12 NOTE — Progress Notes (Signed)
Daily Session Note  Patient Details  Name: Nicholas Arnold MRN: [id number] Date of Birth: Feb 13, 1966 Referring Provider:   Flowsheet Row Cardiac Rehab from 02/07/2023 in [facility] Cardiac and Pulmonary Rehab  Referring Provider Julien Nordmann       Encounter Date: 05/12/2023  Check In:  Session Check In - 05/12/23 0921       Check-In   Supervising physician immediately available to respond to emergencies See telemetry face sheet for immediately available ER MD    Location [facility]-Cardiac & Pulmonary Rehab    Staff Present Ronette Deter, BS, Exercise Physiologist;Joseph [place], RCP,RRT,BSRT;Maxon [place] BS, , Exercise Physiologist;Sarah-Jane Nazario Katrinka Blazing, RN, [place]    Virtual Visit No    Medication changes reported     No    Fall or balance concerns reported    No    Warm-up and Cool-down Performed on first and last piece of equipment    Resistance Training Performed Yes    VAD Patient? No    PAD/SET Patient? No      Pain Assessment   Currently in Pain? No/denies                Social History   Tobacco Use  Smoking Status Never  Smokeless Tobacco Never    Goals Met:  Independence with exercise equipment Exercise tolerated well No report of concerns or symptoms today Strength training completed today  Goals Unmet:  Not Applicable  Comments:  Zyad graduated today from  rehab with 36 sessions completed.  Details of the patient's exercise prescription and what He needs to do in order to continue the prescription and progress were discussed with patient.  Patient was given a copy of prescription and goals.  Patient verbalized understanding. Yuvan plans to continue to exercise by treadmill at home.    Dr. Bethann Punches is Medical Director for [facility] Cardiac Rehabilitation.  Dr. Vida Rigger is Medical Director for [facility] Pulmonary Rehabilitation.

## 2023-05-12 NOTE — Progress Notes (Signed)
Discharge Summary:  Nicholas Arnold  (DOB: Aug 06, 1965)   Nicholas Arnold graduated today from  rehab with 36 sessions completed.  Details of the patient's exercise prescription and what He needs to do in order to continue the prescription and progress were discussed with patient.  Patient was given a copy of prescription and goals.  Patient verbalized understanding. Nicholas Arnold plans to continue to exercise by treadmill at home.   6 Minute Walk     Row Name 02/07/23 1633 05/03/23 0940       6 Minute Walk   Phase Initial Discharge    Distance 1890 feet 2030 feet    Distance % Change -- 7.4 %    Distance Feet Change -- 140 ft    Walk Time 6 minutes 6 minutes    # of Rest Breaks 0 0    MPH 3.58 3.84    METS 4.97 4.93    RPE 7 11    Perceived Dyspnea  0 0    VO2 Peak 17.39 17.25    Symptoms No No    Resting HR 67 bpm 67 bpm    Resting BP 92/62 104/62    Resting Oxygen Saturation  98 % 98 %    Exercise Oxygen Saturation  during 6 min walk 99 % 94 %    Max Ex. HR 127 bpm 104 bpm    Max Ex. BP 126/70 --    2 Minute Post BP 112/74 --

## 2023-05-16 ENCOUNTER — Other Ambulatory Visit: Payer: Self-pay | Admitting: Emergency Medicine

## 2023-05-16 ENCOUNTER — Other Ambulatory Visit: Payer: Self-pay

## 2023-05-16 DIAGNOSIS — Z79899 Other long term (current) drug therapy: Secondary | ICD-10-CM

## 2023-05-16 MED ORDER — METOPROLOL SUCCINATE ER 25 MG PO TB24: 25.0000 mg | ORAL_TABLET | Freq: Every day | ORAL | 3 refills | Status: DC

## 2023-05-16 MED ORDER — ATORVASTATIN CALCIUM 20 MG PO TABS: 20.0000 mg | ORAL_TABLET | Freq: Every day | ORAL | 3 refills | Status: DC

## 2023-05-16 MED ORDER — EZETIMIBE 10 MG PO TABS: 10.0000 mg | ORAL_TABLET | Freq: Every day | ORAL | 3 refills | Status: DC

## 2023-05-16 NOTE — Telephone Encounter (Signed)
Refill request to send established refill to new [facility].  Requested Prescriptions   Signed Prescriptions Disp Refills   metoprolol succinate (TOPROL-XL) 25 MG 24 hr tablet 90 tablet 3    Sig: Take 1 tablet (25 mg total) by mouth daily. Take with or immediately following a meal.    Authorizing Provider: Antonieta Iba    Ordering User: Feliberto Harts L   last office visit: 01/31/23 plan to f/u in 6 months  Next office visit:  08/02/23

## 2023-07-05 ENCOUNTER — Other Ambulatory Visit: Payer: BC Managed Care – PPO

## 2023-07-12 DIAGNOSIS — G4733 Obstructive sleep apnea (adult) (pediatric): Secondary | ICD-10-CM | POA: Diagnosis not present

## 2023-07-19 ENCOUNTER — Other Ambulatory Visit: Payer: Self-pay | Admitting: Primary Care

## 2023-07-19 DIAGNOSIS — Z125 Encounter for screening for malignant neoplasm of prostate: Secondary | ICD-10-CM

## 2023-07-19 DIAGNOSIS — R7303 Prediabetes: Secondary | ICD-10-CM

## 2023-07-19 DIAGNOSIS — R7989 Other specified abnormal findings of blood chemistry: Secondary | ICD-10-CM

## 2023-07-19 DIAGNOSIS — E785 Hyperlipidemia, unspecified: Secondary | ICD-10-CM

## 2023-08-01 NOTE — Progress Notes (Unsigned)
 Cardiology Office Note  Date:  08/02/2023   ID:  Nicholas Arnold, DOB 11-Aug-1965, MRN [id number]  PCP:  Doreene Nest, NP   Chief Complaint  Patient presents with   6 month follow up    Patient would like to discuss the 30 day monitor he wore when he was on a trip in [place], [place]. Patient c/o feeling tired all the time & has occasional fluttering in chest.     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 of the RPAV branch with scad type III   cardiac MRI inferior infarct  Who presents for follow-up of his non-STEMI, coronary dissection  Last seen by myself in clinic September 2024  02/04/23: chest pressure after eating Did not go away Went to the ER parking lot, Tums, and eventually symptoms resolved, felt it was indigestion  Did not go in the ER symptoms had improved  Reports diagnosed with sleep apnea, now with dental device  30-day monitor from outside hospital reviewed  Reports that he wore the monitor before having dental guard in place for sleep apnea  pause when sleeping, short junctional rhythm rate in the 30s, pauses, PVCs in bigeminal pattern while sleeping  Reports now followed by sleep apnea group Better snoring with dental device  Active with regular exercise  treadmill 5x a week every morning Denies shortness of breath or chest pain on exertion  EKG personally reviewed by myself on todays visit EKG Interpretation Date/Time:  Tuesday August 02 2023 10:37:25 EDT Ventricular Rate:  76 PR Interval:  154 QRS Duration:  88 QT Interval:  384 QTC Calculation: 432 R Axis:   -41  Text Interpretation: Normal sinus rhythm Left axis deviation Inferior infarct (cited on or before 31-Jan-2023) When compared with ECG of 31-Jan-2023 14:06, No significant change was found Confirmed by Julien Nordmann ([id number]) on 08/02/2023 10:39:30 AM   Past medical history  reviewed Was at [facility] [place] with friends when he reports feeling that he overdid it;  Able to get up on the wakeboard for over 2 minutes before feeling exhausted, getting back into the boat 1 hour later became extremely weak, short of breath, chest tightness, slumped over and had what sounds like agonal breathing and questionable upper extremity shaking. His lips were reportedly purple. Friends tried to get him on his back and in the process he firmly fell on the deck of the boat, causing him to come to. He was reportedly unconscious for 1-2 minutes. He had no loss of bowel/bladder or tongue biting. It took him 5-10 minutes before he felt back to baseline, denies any confusion upon coming to. Did have 1 alcoholic drink  typically drinks 5-6 beers a day per the notes. No tobacco use, no recreational drug use.   Family history notable for valve disease and heart failure and a maternal grandparent; no history of sudden cardiac death or drowning. Does not take any prescribed medications, takes over-the-counter vitamins.  EMS was called and brought the patient to the emergency room. Some of the rhythm strips via EMS appear to show an irregularly irregular rhythm with no P wave morphology suggestive atrial fibrillation while others show sinus rhythm. Upon arrival to the emergency room the patient was afebrile and hemodynamically stable.  Labs notable for troponin 13,559, potassium 3.2, AST 121, undetectable alcohol, leukocytosis at 15.4. EKG showed sinus rhythm without changes  suggestive of ischemia. CTA chest obtained and showed no evidence of PE and just a 5 mm LUL nodule. Patient was loaded with aspirin, started on heparin and cardiology was consulted for admission."  [name] was admitted to cardiology for NSTEMI. High-sensitivity troponin peaked 80,518 and EKG with inferior T wave inversion.   cardiac cath which noted 50% stenosis of the RPAV  branch with scad type III which is likely the culprit lesion.    cardiac MRI inferior infarct findings on cath. Given possible seizure-like activity during syncopal episode, neurology was consulted. It was felt that shaking behavior was likely convulsive syncope raising question for arrhythmia. Could consider EEG as an outpatient if further concern arises. He was monitored for 48 hours with no evidence of arrhythmia on telemetry. With EMS there was some question of possible atrial fibrillation, however this was not demonstrated during admission. At this time, he appears stable for discharge with 30-day ACT monitor. Patient is to follow-up with PCP in 1 week and is planning to establish cardiology follow-up closer to home in [place].   Cardiac MRI January 24, 2023 Consistent with myocardial infarction involving the inferior wall  1.  Low normal left ventricular systolic function.  Left ventricular EF is 54%.  Mild inferior wall hypokinesis  2.  Low normal right ventricular systolic function.  Right ventricular EF is 53%.  3.  There is small subendocardial late gadolinium enhancement involving the mid inferior wall consistent with infarction in the right coronary artery territory (probably viable, <25 % wall thickness. <1% LV mass)  4.  There is prolonged T2 time in the inferior wall consistent with acute myocardial edema.    5.  There is no evidence of intracavitary thrombus  6.  There is no significant pericardial effusion  7.  No significant valve disease     PMH:   has a past medical history of Chickenpox, GERD (gastroesophageal reflux disease), Hyperlipemia, Neck mass, Prediabetes, and Pyloric stenosis.  PSH:    Past Surgical History:  Procedure Laterality Date   Left heart catherization Left 01/24/2023   NASAL SEPTUM SURGERY     VASECTOMY      Current Outpatient Medications  Medication Sig Dispense Refill   ascorbic acid (VITAMIN C) 250 MG CHEW Chew 2 tablets by mouth daily.      aspirin EC 81 MG tablet Take 81 mg by mouth daily. Swallow whole.     atorvastatin (LIPITOR) 20 MG tablet Take 1 tablet (20 mg total) by mouth daily. 90 tablet 3   Cholecalciferol 10 MCG (400 UNIT) CHEW Chew 2 tablets by mouth daily.     ezetimibe (ZETIA) 10 MG tablet Take 1 tablet (10 mg total) by mouth daily. 90 tablet 3   metoprolol succinate (TOPROL-XL) 25 MG 24 hr tablet Take 1 tablet (25 mg total) by mouth daily. Take with or immediately following a meal. 90 tablet 3   No current facility-administered medications for this visit.    Allergies:   Bee venom   Social History:  The patient  reports that he has never smoked. He has never used smokeless tobacco. He reports current alcohol use. He reports that he does not use drugs.   Family History:   family history includes Alcohol abuse in his paternal grandfather; Arthritis in his father and maternal grandmother; Colon cancer in his maternal uncle; Heart disease in his maternal grandmother; Hyperlipidemia in his father, maternal grandmother, and mother; Hypertension in his brother, father, maternal grandfather, maternal grandmother, and mother;  Hypotension in his brother; Stroke in his maternal grandfather.    Review of Systems: Review of Systems  Constitutional: Negative.   HENT: Negative.    Respiratory: Negative.    Cardiovascular: Negative.   Gastrointestinal: Negative.   Musculoskeletal: Negative.   Neurological: Negative.   Psychiatric/Behavioral: Negative.    All other systems reviewed and are negative.    PHYSICAL EXAM: VS:  BP 120/80 (BP Location: Left Arm, Patient Position: Sitting, Cuff Size: Normal)   Pulse 76   Ht 5\' 7"  (1.702 m)   Wt 199 lb 2 oz (90.3 kg)   SpO2 97%   BMI 31.19 kg/m  , BMI Body mass index is 31.19 kg/m. Constitutional:  oriented to person, place, and time. No distress.  HENT:  Head: Grossly normal Eyes:  no discharge. No scleral icterus.  Neck: No JVD, no carotid bruits  Cardiovascular:  Regular rate and rhythm, no murmurs appreciated Pulmonary/Chest: Clear to auscultation bilaterally, no wheezes or rails Abdominal: Soft.  no distension.  no tenderness.  Musculoskeletal: Normal range of motion Neurological:  normal muscle tone. Coordination normal. No atrophy Skin: Skin warm and dry Psychiatric: normal affect, pleasant  Recent Labs: 05/10/2023: ALT 107    Lipid Panel Lab Results  Component Value Date   CHOL 115 04/05/2023   HDL 38.30 (L) 04/05/2023   LDLCALC 63 04/05/2023   TRIG 64.0 04/05/2023      Wt Readings from Last 3 Encounters:  08/02/23 199 lb 2 oz (90.3 kg)  05/03/23 186 lb (84.4 kg)  02/07/23 187 lb 9.6 oz (85.1 kg)     ASSESSMENT AND PLAN:  Problem List Items Addressed This Visit       Cardiology Problems   Hypertension   Relevant Orders   EKG 12-Lead (Completed)   PAF (paroxysmal atrial fibrillation) (HCC)   Relevant Orders   EKG 12-Lead (Completed)   Hyperlipidemia   Coronary artery dissection - Primary   Relevant Orders   EKG 12-Lead (Completed)   Other Visit Diagnoses       Non-ST elevation (NSTEMI) myocardial infarction (HCC)       Relevant Orders   EKG 12-Lead (Completed)       Non-STEMI Coronary dissection of right coronary artery PL branch MRI detailing inferior wall hypokinesis supported by T wave inversions on EKG Completed cardiac rehab, now exercises 5 days a week treadmill 2 to 3 miles at a time, asymptomatic Recommend he continue aspirin, Lipitor 20, Zetia, metoprolol succinate 25 daily Stress reduction recommended  Hyperlipidemia Continue Lipitor 20 daily, Zetia 10 daily  Repeat lipid panel pending today  goal LDL less than 55 If numbers run high, could add bempedoic acid with Zetia  Inferior wall MI Noted on cardiac MRI Ejection for action 50% ACE ARB Arni previously held for low blood pressure  Sleep apnea Followed by sleep study lab, has dental device in place, snoring much improved Reports he is  being scheduled for repeat sleep study at home Prior to wearing dental device had short runs of junctional bradycardia, PVC in a bigeminal pattern, pauses   Signed, Dossie Arbour, M.D., Ph.D. [facility] [place], [place] [phone]

## 2023-08-02 ENCOUNTER — Encounter: Payer: Self-pay | Admitting: Cardiovascular Disease

## 2023-08-02 ENCOUNTER — Other Ambulatory Visit (INDEPENDENT_AMBULATORY_CARE_PROVIDER_SITE_OTHER): Payer: BC Managed Care – PPO

## 2023-08-02 ENCOUNTER — Ambulatory Visit: Payer: BC Managed Care – PPO | Admitting: Cardiovascular Disease

## 2023-08-02 ENCOUNTER — Encounter: Payer: BC Managed Care – PPO | Admitting: Primary Care

## 2023-08-02 ENCOUNTER — Ambulatory Visit: Payer: BC Managed Care – PPO | Attending: Cardiovascular Disease | Admitting: Cardiovascular Disease

## 2023-08-02 VITALS — BP 120/80 | HR 76 | Ht 67.0 in | Wt 199.1 lb

## 2023-08-02 DIAGNOSIS — Z125 Encounter for screening for malignant neoplasm of prostate: Secondary | ICD-10-CM | POA: Diagnosis not present

## 2023-08-02 DIAGNOSIS — I214 Non-ST elevation (NSTEMI) myocardial infarction: Secondary | ICD-10-CM

## 2023-08-02 DIAGNOSIS — E782 Mixed hyperlipidemia: Secondary | ICD-10-CM

## 2023-08-02 DIAGNOSIS — I2542 Coronary artery dissection: Secondary | ICD-10-CM | POA: Diagnosis not present

## 2023-08-02 DIAGNOSIS — E785 Hyperlipidemia, unspecified: Secondary | ICD-10-CM

## 2023-08-02 DIAGNOSIS — I10 Essential (primary) hypertension: Secondary | ICD-10-CM | POA: Diagnosis not present

## 2023-08-02 DIAGNOSIS — R7989 Other specified abnormal findings of blood chemistry: Secondary | ICD-10-CM

## 2023-08-02 DIAGNOSIS — I480 Paroxysmal atrial fibrillation: Secondary | ICD-10-CM

## 2023-08-02 DIAGNOSIS — R7303 Prediabetes: Secondary | ICD-10-CM | POA: Diagnosis not present

## 2023-08-02 LAB — COMPREHENSIVE METABOLIC PANEL
ALT: 74 U/L — ABNORMAL HIGH (ref 0–53)
AST: 42 U/L — ABNORMAL HIGH (ref 0–37)
Albumin: 4.3 g/dL (ref 3.5–5.2)
Alkaline Phosphatase: 50 U/L (ref 39–117)
BUN: 12 mg/dL (ref 6–23)
CO2: 28 meq/L (ref 19–32)
Calcium: 9.1 mg/dL (ref 8.4–10.5)
Chloride: 106 meq/L (ref 96–112)
Creatinine, Ser: 0.93 mg/dL (ref 0.40–1.50)
GFR: 92.47 mL/min (ref >60.00)
Glucose, Bld: 101 mg/dL — ABNORMAL HIGH (ref 70–99)
Potassium: 3.9 meq/L (ref 3.5–5.1)
Sodium: 141 meq/L (ref 135–145)
Total Bilirubin: 0.6 mg/dL (ref 0.2–1.2)
Total Protein: 6.3 g/dL (ref 6.0–8.3)

## 2023-08-02 LAB — PSA: PSA: 0.49 ng/mL (ref 0.10–4.00)

## 2023-08-02 LAB — LIPID PANEL
Cholesterol: 120 mg/dL (ref 0–200)
HDL: 43.7 mg/dL (ref >39.00)
LDL Cholesterol: 65 mg/dL (ref 0–99)
NonHDL: 76.17
Total CHOL/HDL Ratio: 3
Triglycerides: 58 mg/dL (ref 0.0–149.0)
VLDL: 11.6 mg/dL (ref 0.0–40.0)

## 2023-08-02 LAB — HEMOGLOBIN A1C: Hgb A1c MFr Bld: 6 % (ref 4.6–6.5)

## 2023-08-02 NOTE — Patient Instructions (Signed)

## 2023-08-10 ENCOUNTER — Encounter: Payer: Self-pay | Admitting: Cardiovascular Disease

## 2023-08-11 ENCOUNTER — Encounter: Payer: Self-pay | Admitting: Primary Care

## 2023-08-11 ENCOUNTER — Ambulatory Visit (INDEPENDENT_AMBULATORY_CARE_PROVIDER_SITE_OTHER): Payer: BC Managed Care – PPO | Admitting: Primary Care

## 2023-08-11 VITALS — BP 132/84 | HR 82 | Temp 98.2°F | Ht 67.0 in | Wt 198.0 lb

## 2023-08-11 DIAGNOSIS — Z0000 Encounter for general adult medical examination without abnormal findings: Secondary | ICD-10-CM

## 2023-08-11 DIAGNOSIS — G4730 Sleep apnea, unspecified: Secondary | ICD-10-CM

## 2023-08-11 DIAGNOSIS — I480 Paroxysmal atrial fibrillation: Secondary | ICD-10-CM | POA: Diagnosis not present

## 2023-08-11 DIAGNOSIS — I10 Essential (primary) hypertension: Secondary | ICD-10-CM | POA: Diagnosis not present

## 2023-08-11 DIAGNOSIS — R7303 Prediabetes: Secondary | ICD-10-CM

## 2023-08-11 DIAGNOSIS — E785 Hyperlipidemia, unspecified: Secondary | ICD-10-CM

## 2023-08-11 NOTE — Assessment & Plan Note (Signed)
Stable.  Continue metoprolol succinate 25 mg daily. 

## 2023-08-11 NOTE — Assessment & Plan Note (Signed)
 Continue with dental appliance.  Due for follow up sleep study later this year.

## 2023-08-11 NOTE — Assessment & Plan Note (Signed)
 Immunizations UTD. Colonoscopy UTD, due 2027 PSA UTD.  Discussed the importance of a healthy diet and regular exercise in order for weight loss, and to reduce the risk of further co-morbidity.  Exam stable. Labs pending.  Follow up in 1 year for repeat physical.

## 2023-08-11 NOTE — Patient Instructions (Signed)
 Continue with regular exercise.   I will be in touch again soon!  It was a pleasure to see you today!

## 2023-08-11 NOTE — Progress Notes (Signed)
 Subjective:    Patient ID: Nicholas Arnold, male    DOB: 23-Jun-1965, 58 y.o.   MRN: [id number]  HPI  Nicholas Arnold is a very pleasant 58 y.o. male who presents today for complete physical and follow up of chronic conditions.  Immunizations: -Tetanus: Completed in 2016 -Shingles: Completed Shingrix series  Diet: Fair diet.  Exercise: Regular exercise, 5 days weekly   Eye exam: Completed >1 year ago Dental exam: Completes semi-annually    Colonoscopy: Completed in 2017, due 2027  PSA: Due   BP Readings from Last 3 Encounters:  08/11/23 132/84  08/02/23 120/80  02/02/23 118/82        Review of Systems  Constitutional:  Negative for unexpected weight change.  HENT:  Negative for rhinorrhea.   Respiratory:  Negative for cough and shortness of breath.   Cardiovascular:  Negative for chest pain.  Gastrointestinal:  Negative for constipation and diarrhea.  Genitourinary:  Negative for difficulty urinating.  Musculoskeletal:  Negative for arthralgias and myalgias.  Skin:  Negative for rash.  Allergic/Immunologic: Negative for environmental allergies.  Neurological:  Negative for dizziness, numbness and headaches.  Psychiatric/Behavioral:  The patient is not nervous/anxious.          Past Medical History:  Diagnosis Date   Chest discomfort 01/02/2021   Chickenpox    GERD (gastroesophageal reflux disease)    Hyperlipemia    Neck mass    removed in 2012, benign   NSTEMI (non-ST elevated myocardial infarction) (HCC) 01/23/2023   Prediabetes    Pyloric stenosis     Social History   Socioeconomic History   Marital status: Married    Spouse name: Not on file   Number of children: Not on file   Years of education: Not on file   Highest education level: Associate degree: occupational, Scientist, product/process development, or vocational program  Occupational History   Occupation: Financial planner  Tobacco Use   Smoking status: Never   Smokeless tobacco: Never  Vaping Use   Vaping  status: Never Used  Substance and Sexual Activity   Alcohol use: Yes    Alcohol/week: 0.0 standard drinks of alcohol    Comment: social   Drug use: No   Sexual activity: Yes  Other Topics Concern   Not on file  Social History Narrative   Married.   No children.   Works as a Financial planner.   Enjoys riding his motorcycle, spending time with pets and family, car maintenance, playing video games.   Social Drivers of Corporate investment banker Strain: Low Risk  (08/08/2023)   Overall Financial Resource Strain (CARDIA)    Difficulty of Paying Living Expenses: Not hard at all  Food Insecurity: No Food Insecurity (08/08/2023)   Hunger Vital Sign    Worried About Running Out of Food in the Last Year: Never true    Ran Out of Food in the Last Year: Never true  Transportation Needs: No Transportation Needs (08/08/2023)   PRAPARE - Administrator, Civil Service (Medical): No    Lack of Transportation (Non-Medical): No  Physical Activity: Sufficiently Active (08/08/2023)   Exercise Vital Sign    Days of Exercise per Week: 5 days    Minutes of Exercise per Session: 50 min  Stress: No Stress Concern Present (08/08/2023)   [employer] of Occupational Health - Occupational Stress Questionnaire    Feeling of Stress : Not at all  Social Connections: Moderately Integrated (08/08/2023)   Social Connection and Isolation  Panel [NHANES]    Frequency of Communication with Friends and Family: Three times a week    Frequency of Social Gatherings with Friends and Family: Once a week    Attends Religious Services: More than 4 times per year    Active Member of [employer] or Organizations: No    Attends Engineer, structural: Not on file    Marital Status: Married  Catering manager Violence: Not At Risk (01/24/2023)   Received from [employer], Afraid, Rape, and Kick questionnaire    Fear of Current or Ex-Partner: No    Emotionally Abused: No    Physically Abused:  No    Sexually Abused: No    Past Surgical History:  Procedure Laterality Date   Left heart catherization Left 01/24/2023   NASAL SEPTUM SURGERY     VASECTOMY      Family History  Problem Relation Age of Onset   Hyperlipidemia Mother    Hypertension Mother    Arthritis Father    Hyperlipidemia Father    Hypertension Father    Hypertension Brother    Hypotension Brother    Colon cancer Maternal Uncle    Arthritis Maternal Grandmother    Hyperlipidemia Maternal Grandmother    Heart disease Maternal Grandmother    Hypertension Maternal Grandmother    Stroke Maternal Grandfather    Hypertension Maternal Grandfather    Alcohol abuse Paternal Grandfather     Allergies  Allergen Reactions   Bee Venom Swelling    Current Outpatient Medications on File Prior to Visit  Medication Sig Dispense Refill   ascorbic acid (VITAMIN C) 250 MG CHEW Chew 2 tablets by mouth daily.     aspirin EC 81 MG tablet Take 81 mg by mouth daily. Swallow whole.     atorvastatin (LIPITOR) 20 MG tablet Take 1 tablet (20 mg total) by mouth daily. 90 tablet 3   Cholecalciferol 10 MCG (400 UNIT) CHEW Chew 2 tablets by mouth daily.     Cyanocobalamin (VITAMIN B12 PO) Take by mouth.     ezetimibe (ZETIA) 10 MG tablet Take 1 tablet (10 mg total) by mouth daily. 90 tablet 3   metoprolol succinate (TOPROL-XL) 25 MG 24 hr tablet Take 1 tablet (25 mg total) by mouth daily. Take with or immediately following a meal. 90 tablet 3   No current facility-administered medications on file prior to visit.    BP 132/84   Pulse 82   Temp 98.2 F (36.8 C) (Temporal)   Ht 5\' 7"  (1.702 m)   Wt 198 lb (89.8 kg)   SpO2 97%   BMI 31.01 kg/m  Objective:   Physical Exam HENT:     Right Ear: Tympanic membrane and ear canal normal.     Left Ear: Tympanic membrane and ear canal normal.  Eyes:     Pupils: Pupils are equal, round, and reactive to light.  Cardiovascular:     Rate and Rhythm: Normal rate and regular  rhythm.  Pulmonary:     Effort: Pulmonary effort is normal.     Breath sounds: Normal breath sounds.  Abdominal:     General: Bowel sounds are normal.     Palpations: Abdomen is soft.     Tenderness: There is no abdominal tenderness.  Musculoskeletal:        General: Normal range of motion.     Cervical back: Neck supple.  Skin:    General: Skin is warm and dry.  Neurological:  Mental Status: He is alert and oriented to person, place, and time.     Cranial Nerves: No cranial nerve deficit.     Deep Tendon Reflexes:     Reflex Scores:      Patellar reflexes are 2+ on the right side and 2+ on the left side. Psychiatric:        Mood and Affect: Mood normal.           Assessment & Plan:  Sleep apnea, unspecified type Assessment & Plan: Continue with dental appliance.  Due for follow up sleep study later this year.   PAF (paroxysmal atrial fibrillation) (HCC) Assessment & Plan: Rate and rhythm regular today.  Continue metoprolol succinate 25 mg daily. Continue aspirin 81 mg daily.  Reviewed cardiology notes from March 2025.   Primary hypertension Assessment & Plan: Stable.  Continue metoprolol succinate 25 mg daily.   Hyperlipidemia, unspecified hyperlipidemia type Assessment & Plan: LDL slightly above goal with LDL of 65, goal is <55.  Reviewed cardiology notes for recommendations for bempedoic acid in addition to Zetia and atorvastatin. Discussed with patient. Will reach out to cardiology to confirm.  Do not recommend increasing atorvastatin due to liver enzyme levels. Continue atorvastatin 20 mg daily and Zetia 10 mg daily.  Continue with regular exercise and improving diet.    Prediabetes Assessment & Plan: Deteriorated with A1C of 6.0, discussed this today.  Commended him on his exercise routine. Work on diet.  Will continue to monitor.    Preventative health care Assessment & Plan: Immunizations UTD. Colonoscopy UTD, due 2027 PSA  UTD.  Discussed the importance of a healthy diet and regular exercise in order for weight loss, and to reduce the risk of further co-morbidity.  Exam stable. Labs pending.  Follow up in 1 year for repeat physical.          Doreene Nest, NP

## 2023-08-11 NOTE — Assessment & Plan Note (Signed)
 Deteriorated with A1C of 6.0, discussed this today.  Commended him on his exercise routine. Work on diet.  Will continue to monitor.

## 2023-08-11 NOTE — Assessment & Plan Note (Signed)
 Rate and rhythm regular today.  Continue metoprolol succinate 25 mg daily. Continue aspirin 81 mg daily.  Reviewed cardiology notes from March 2025.

## 2023-08-11 NOTE — Assessment & Plan Note (Signed)
 LDL slightly above goal with LDL of 65, goal is <55.  Reviewed cardiology notes for recommendations for bempedoic acid in addition to Zetia and atorvastatin. Discussed with patient. Will reach out to cardiology to confirm.  Do not recommend increasing atorvastatin due to liver enzyme levels. Continue atorvastatin 20 mg daily and Zetia 10 mg daily.  Continue with regular exercise and improving diet.

## 2023-08-12 ENCOUNTER — Telehealth: Payer: Self-pay | Admitting: Emergency Medicine

## 2023-08-12 MED ORDER — NEXLIZET 180-10 MG PO TABS: 1.0000 | ORAL_TABLET | Freq: Every day | ORAL | Status: AC

## 2023-08-12 NOTE — Telephone Encounter (Addendum)
 Called patient and notified him of the following from Dr. Mariah Milling.  If he is on Zetia we can combine Zetia with bempedoic acid which is called Nexlizet  I should have a commercial pay coupon in the office  We can try to send  it in from this end   Patient verbalizes understanding. Coupon code and samples placed in sample closet for patient. Patient states that he does not want me to send a prescription in at this time. He states that he will call and let me know when he wants the prescription sent in.

## 2023-08-12 NOTE — Telephone Encounter (Signed)
-----   Message from Julien Nordmann sent at 08/12/2023  8:01 AM EDT ----- Regarding: RE: LDL If he is on Zetia we can combine Zetia with bempedoic acid which is called Nexlizet I should have a commercial pay coupon in the office We can try to send  it in from this end Marcelino Duster can you help me send this in? Thx TG ----- Message ----- From: Doreene Nest, NP Sent: 08/11/2023   8:40 AM EDT To: Antonieta Iba, MD Subject: Grant Fontana!  I hope all is well!  I just saw our mutual patient this morning. LDL is above goal with current level of 65. I saw your recommendation for bempedoic acid. I'm not familiar with this one, anything special for prescribing?  Thanks! Mayra Reel, NP-C

## 2023-10-19 DIAGNOSIS — G4733 Obstructive sleep apnea (adult) (pediatric): Secondary | ICD-10-CM | POA: Diagnosis not present

## 2024-01-05 DIAGNOSIS — G4733 Obstructive sleep apnea (adult) (pediatric): Secondary | ICD-10-CM | POA: Diagnosis not present

## 2024-04-10 ENCOUNTER — Other Ambulatory Visit: Payer: Self-pay | Admitting: Cardiovascular Disease

## 2024-04-25 ENCOUNTER — Other Ambulatory Visit: Payer: Self-pay | Admitting: Cardiovascular Disease

## 2024-08-14 ENCOUNTER — Encounter: Admitting: Primary Care
